# Patient Record
Sex: Female | Born: 1980 | Race: Black or African American | Hispanic: No | Marital: Single | Smoking: Former smoker
Health system: Southern US, Community
[De-identification: ages and names within clinical notes are randomized; demographics above are authoritative.]

## PROBLEM LIST (undated history)

## (undated) ENCOUNTER — Inpatient Hospital Stay (HOSPITAL_COMMUNITY): Payer: Self-pay

## (undated) DIAGNOSIS — R51 Headache: Secondary | ICD-10-CM

## (undated) DIAGNOSIS — J45909 Unspecified asthma, uncomplicated: Secondary | ICD-10-CM

## (undated) DIAGNOSIS — I10 Essential (primary) hypertension: Secondary | ICD-10-CM

## (undated) DIAGNOSIS — Z789 Other specified health status: Secondary | ICD-10-CM

## (undated) DIAGNOSIS — R519 Headache, unspecified: Secondary | ICD-10-CM

## (undated) HISTORY — DX: Unspecified asthma, uncomplicated: J45.909

## (undated) HISTORY — PX: NO PAST SURGERIES: SHX2092

---

## 2012-07-04 ENCOUNTER — Ambulatory Visit (INDEPENDENT_AMBULATORY_CARE_PROVIDER_SITE_OTHER): Payer: Managed Care, Other (non HMO) | Admitting: Physician Assistant

## 2012-07-04 VITALS — BP 120/92 | HR 101 | Temp 98.1°F | Resp 16 | Ht 65.0 in | Wt 194.0 lb

## 2012-07-04 DIAGNOSIS — J020 Streptococcal pharyngitis: Secondary | ICD-10-CM

## 2012-07-04 DIAGNOSIS — J029 Acute pharyngitis, unspecified: Secondary | ICD-10-CM

## 2012-07-04 MED ORDER — DIPHENHYD-HYDROCORT-NYSTATIN MT SUSP: 5.0000 mL | Freq: Four times a day (QID) | OROMUCOSAL | Status: DC | PRN

## 2012-07-04 MED ORDER — AMOXICILLIN 875 MG PO TABS: 875.0000 mg | ORAL_TABLET | Freq: Two times a day (BID) | ORAL | Status: DC

## 2012-07-04 NOTE — Progress Notes (Signed)
    Patient ID: Alexandra Curtis MRN: [id number], DOB: 1981-09-22, 31 y.o. Date of Encounter: 07/04/2012, 6:31 PM  Primary Physician: No primary provider on file.  Chief Complaint:  Chief Complaint  Patient presents with  . Sore Throat    yesterday  . Chills  . Night Sweats    HPI: 31 y.o. year old female presents with a one day history of sore throat. Subjective fever and chills. No cough, congestion, rhinorrhea, sinus pressure, otalgia, or headache. Normal hearing. No GI complaints. Able to swallow saliva, but hurts to do so. Decreased appetite secondary to sore throat. Multiple sick contacts at work as she works in [employer].    No past medical history on file.   Home Meds: Prior to Admission medications   Not on File    Allergies: No Known Allergies  History   Social History  . Marital Status: Single    Spouse Name: N/A    Number of Children: N/A  . Years of Education: N/A   Occupational History  . Not on file.   Social History Main Topics  . Smoking status: Current Everyday Smoker -- 0.2 packs/day    Types: Cigarettes  . Smokeless tobacco: Not on file  . Alcohol Use: Not on file  . Drug Use: Not on file  . Sexually Active: Not on file   Other Topics Concern  . Not on file   Social History Narrative  . No narrative on file     Review of Systems: Constitutional: negative for night sweats or weight changes HEENT: see above Cardiovascular: negative for chest pain or palpitations Respiratory: negative for hemoptysis, wheezing, or shortness of breath Abdominal: negative for abdominal pain, nausea, vomiting or diarrhea Dermatological: negative for rash Neurologic: negative for headache   Physical Exam: Blood pressure 120/92, pulse 101, temperature 98.1 F (36.7 C), temperature source Oral, resp. rate 16, height 5\' 5"  (1.651 m), weight 194 lb (87.998 kg), last menstrual period 06/25/2012, SpO2 99.00%., Body mass index is 32.28 kg/(m^2). General: Well  developed, well nourished, in no acute distress. Head: Normocephalic, atraumatic, eyes without discharge, sclera non-icteric, nares are patent. Bilateral auditory canals clear, TM's are without perforation, pearly grey with reflective cone of light bilaterally. No sinus TTP. Oral cavity moist, dentition normal. Posterior pharynx erythematous with tonsillar exudate. Tonsils 2+ bilaterally. No peritonsillar abscess. Neck: Supple. No thyromegaly. Full ROM. <2cm AC. Lungs: Clear bilaterally to auscultation without wheezes, rales, or rhonchi. Breathing is unlabored. Heart: RRR with S1 S2. No murmurs, rubs, or gallops appreciated. Msk:  Strength and tone normal for age. Extremities: No clubbing or cyanosis. No edema. Neuro: Alert and oriented X 3. Moves all extremities spontaneously. CNII-XII grossly in tact. Psych:  Responds to questions appropriately with a normal affect.   Labs: Results for orders placed in visit on 07/04/12  POCT RAPID STREP A (OFFICE)      Component Value Range   Rapid Strep A Screen Positive (*) Negative     ASSESSMENT AND PLAN:  31 y.o. year old female with strep pharyngitis -Amoxicillin 875 mg 1 po bid #20 no RF -DMM with lidocaine 1 tsp po qid prn #120 mL no RF -New tooth brush -Tylenol/Motrin prn -Rest/fluids -RTC precautions -RTC 3-5 days if no improvement  Elinor Dodge, PA-C 07/04/2012 6:31 PM

## 2012-08-05 ENCOUNTER — Ambulatory Visit: Payer: Managed Care, Other (non HMO) | Admitting: Emergency Medicine

## 2012-08-05 VITALS — BP 136/91 | HR 69 | Temp 97.8°F | Resp 18 | Ht 65.5 in | Wt 190.2 lb

## 2012-08-05 DIAGNOSIS — R21 Rash and other nonspecific skin eruption: Secondary | ICD-10-CM

## 2012-08-05 LAB — POCT SKIN KOH: Skin KOH, POC: NEGATIVE

## 2012-08-05 MED ORDER — PREDNISONE 10 MG PO TABS: ORAL_TABLET | ORAL | Status: DC

## 2012-08-05 NOTE — Progress Notes (Signed)
  Subjective:    Patient ID: Alexandra Curtis, female    DOB: December 07, 1981, 31 y.o.   MRN: [id number]  HPI  31 year old Black woman is here today with complaints of bites all over her body for two weeks. Pt states she has been living in a hotel due to her house been renovated and moved in with a friend to lower her expenses. Pt states the rash started about two weeks. Pt states the rash itches and is only on the upper extremities. She has been taking Benadryl but no other medications     Review of Systems     Objective:   Physical Exam there are multiple raised scaly patches involving the trunk. There is sparing of the face sparing of the lower extremities. These areas are excoriated. They did not have the appearance of hives and there no individual bite-like areas  KOH Neg      Assessment & Plan:  Patient has typical psoriasis rosea. I gave her information about this she is itching so badly I'm going to give her a six-day course of prednisone and have her take Zyrtec one a day along with the Benadryl she is taking

## 2012-08-05 NOTE — Patient Instructions (Addendum)
Pityriasis Rosea Pityriasis rosea is a rash which is probably caused by a virus. It generally starts as a scaly, red patch on the trunk (the area of the body that a t-shirt would cover) but does not appear on sun exposed areas. The rash is usually preceded by an initial larger spot called the "herald patch" a week or more before the rest of the rash appears. Generally within one to two days the rash appears rapidly on the trunk, upper arms, and sometimes the upper legs. The rash usually appears as flat, oval patches of scaly pink color. The rash can also be raised and one is able to feel it with a finger. The rash can also be finely crinkled and may slough off leaving a ring of scale around the spot. Sometimes a mild sore throat is present with the rash. It usually affects children and young adults in the spring and autumn. Women are more frequently affected than men. TREATMENT  Pityriasis rosea is a self-limited condition. This means it goes away within 4 to 8 weeks without treatment. The spots may persist for several months, especially in darker-colored skin after the rash has resolved and healed. Benadryl and steroid creams may be used if itching is a problem. SEEK MEDICAL CARE IF:   Your rash does not go away or persists longer than three months.   You develop fever and joint pain.   You develop severe headache and confusion.   You develop breathing difficulty, vomiting and/or extreme weakness.  Document Released: 01/19/2002 Document Revised: 12/02/2011 Document Reviewed: 02/07/2009 ExitCare Patient Information 2012 [employer]. 

## 2012-08-16 ENCOUNTER — Telehealth: Payer: Self-pay

## 2012-08-16 NOTE — Telephone Encounter (Signed)
Broken out with a rash, would like more pills called in [facility] [address] @ [facility].   Call [phone]

## 2012-08-17 NOTE — Telephone Encounter (Signed)
Called pt but voicemail was too full to leave message to get more information.

## 2012-08-21 NOTE — Telephone Encounter (Signed)
Yes, RTC

## 2012-08-21 NOTE — Telephone Encounter (Signed)
Tried to call pt again and VM still full. Could not LM

## 2012-08-21 NOTE — Telephone Encounter (Signed)
Patient would likely need re-eval before more meds could be called in, correct?

## 2012-08-23 NOTE — Telephone Encounter (Signed)
Spoke with patient and she is still experiencing rash, despite using triamcinolone cream and benadryl.  Plans to RTC this Saturday.

## 2012-08-26 ENCOUNTER — Ambulatory Visit: Payer: Managed Care, Other (non HMO)

## 2013-05-16 ENCOUNTER — Other Ambulatory Visit (HOSPITAL_COMMUNITY)
Admission: RE | Admit: 2013-05-16 | Discharge: 2013-05-16 | Disposition: A | Discharge status: Home / Self Care | Payer: Managed Care, Other (non HMO) | Source: Ambulatory Visit | Attending: Family Medicine | Admitting: Family Medicine

## 2013-05-16 ENCOUNTER — Other Ambulatory Visit: Payer: Self-pay | Admitting: Family Medicine

## 2013-05-16 DIAGNOSIS — Z01419 Encounter for gynecological examination (general) (routine) without abnormal findings: Secondary | ICD-10-CM | POA: Insufficient documentation

## 2014-03-07 ENCOUNTER — Ambulatory Visit (INDEPENDENT_AMBULATORY_CARE_PROVIDER_SITE_OTHER): Payer: Managed Care, Other (non HMO) | Admitting: Family Medicine

## 2014-03-07 VITALS — BP 160/98 | HR 97 | Temp 98.9°F | Resp 16 | Ht 65.5 in | Wt 198.6 lb

## 2014-03-07 DIAGNOSIS — S0512XA Contusion of eyeball and orbital tissues, left eye, initial encounter: Secondary | ICD-10-CM

## 2014-03-07 DIAGNOSIS — S0510XA Contusion of eyeball and orbital tissues, unspecified eye, initial encounter: Secondary | ICD-10-CM

## 2014-03-07 MED ORDER — HYDROCODONE-ACETAMINOPHEN 5-325 MG PO TABS: 1.0000 | ORAL_TABLET | Freq: Four times a day (QID) | ORAL | Status: DC | PRN

## 2014-03-07 NOTE — Patient Instructions (Signed)
Rest for 2 days.  Apply cool compresses.

## 2014-03-07 NOTE — Progress Notes (Signed)
Patient who works at [employer] and struck her left orbit when she tripped on a shoe at home. Upper left eyelid is tender and she has a mild headache.  No syncope.  Objective:  NAD Left upper lid ecchymotic.  Small 3/8 inch partial thickness left eyebrow lac.  EOM ok  Good light reflex and normal fundus. Alert, cooperative HEENT: otherwise neg CN III-XII:  Intact Gait stable  Assessment:  Contused left orbit  Plan:  Ice, norco, rest.  Signed, Elvina SidleKurt Aashvi Rezabek, MD

## 2014-09-28 ENCOUNTER — Inpatient Hospital Stay (HOSPITAL_COMMUNITY): Payer: Managed Care, Other (non HMO)

## 2014-09-28 ENCOUNTER — Inpatient Hospital Stay (HOSPITAL_COMMUNITY)
Admission: AD | Admit: 2014-09-28 | Discharge: 2014-09-28 | Disposition: A | Discharge status: Home / Self Care | Payer: Managed Care, Other (non HMO) | Source: Ambulatory Visit | Attending: Family Medicine | Admitting: Family Medicine

## 2014-09-28 ENCOUNTER — Encounter (HOSPITAL_COMMUNITY): Payer: Self-pay | Admitting: *Deleted

## 2014-09-28 DIAGNOSIS — Z3A11 11 weeks gestation of pregnancy: Secondary | ICD-10-CM | POA: Diagnosis not present

## 2014-09-28 DIAGNOSIS — Z87891 Personal history of nicotine dependence: Secondary | ICD-10-CM | POA: Insufficient documentation

## 2014-09-28 DIAGNOSIS — O99011 Anemia complicating pregnancy, first trimester: Secondary | ICD-10-CM | POA: Insufficient documentation

## 2014-09-28 DIAGNOSIS — O200 Threatened abortion: Secondary | ICD-10-CM | POA: Diagnosis not present

## 2014-09-28 DIAGNOSIS — D649 Anemia, unspecified: Secondary | ICD-10-CM | POA: Diagnosis not present

## 2014-09-28 DIAGNOSIS — O4691 Antepartum hemorrhage, unspecified, first trimester: Secondary | ICD-10-CM | POA: Diagnosis present

## 2014-09-28 DIAGNOSIS — O99019 Anemia complicating pregnancy, unspecified trimester: Secondary | ICD-10-CM

## 2014-09-28 HISTORY — DX: Other specified health status: Z78.9

## 2014-09-28 LAB — CBC
HEMATOCRIT: 32.2 % — AB (ref 36.0–46.0)
Hemoglobin: 11 g/dL — ABNORMAL LOW (ref 12.0–15.0)
MCH: 30.6 pg (ref 26.0–34.0)
MCHC: 34.2 g/dL (ref 30.0–36.0)
MCV: 89.7 fL (ref 78.0–100.0)
Platelets: 207 10*3/uL (ref 150–400)
RBC: 3.59 MIL/uL — ABNORMAL LOW (ref 3.87–5.11)
RDW: 13 % (ref 11.5–15.5)
WBC: 6.2 10*3/uL (ref 4.0–10.5)

## 2014-09-28 LAB — WET PREP, GENITAL
Trich, Wet Prep: NONE SEEN
YEAST WET PREP: NONE SEEN

## 2014-09-28 LAB — HCG, QUANTITATIVE, PREGNANCY: hCG, Beta Chain, Quant, S: 16849 m[IU]/mL — ABNORMAL HIGH (ref <5)

## 2014-09-28 MED ORDER — PROMETHAZINE HCL 25 MG PO TABS: 25.0000 mg | ORAL_TABLET | Freq: Four times a day (QID) | ORAL | Status: DC | PRN

## 2014-09-28 MED ORDER — OXYCODONE-ACETAMINOPHEN 5-325 MG PO TABS: 2.0000 | ORAL_TABLET | ORAL | Status: DC | PRN

## 2014-09-28 NOTE — Discharge Instructions (Signed)
Anemia, Nonspecific Anemia is a condition in which the concentration of red blood cells or hemoglobin in the blood is below normal. Hemoglobin is a substance in red blood cells that carries oxygen to the tissues of the body. Anemia results in not enough oxygen reaching these tissues.  CAUSES  Common causes of anemia include:   Excessive bleeding. Bleeding may be internal or external. This includes excessive bleeding from periods (in women) or from the intestine.   Poor nutrition.   Chronic kidney, thyroid, and liver disease.  Bone marrow disorders that decrease red blood cell production.  Cancer and treatments for cancer.  HIV, AIDS, and their treatments.  Spleen problems that increase red blood cell destruction.  Blood disorders.  Excess destruction of red blood cells due to infection, medicines, and autoimmune disorders. SIGNS AND SYMPTOMS   Minor weakness.   Dizziness.   Headache.  Palpitations.   Shortness of breath, especially with exercise.   Paleness.  Cold sensitivity.  Indigestion.  Nausea.  Difficulty sleeping.  Difficulty concentrating. Symptoms may occur suddenly or they may develop slowly.  DIAGNOSIS  Additional blood tests are often needed. These help your health care provider determine the best treatment. Your health care provider will check your stool for blood and look for other causes of blood loss.  TREATMENT  Treatment varies depending on the cause of the anemia. Treatment can include:   Supplements of iron, vitamin B12, or folic acid.   Hormone medicines.   A blood transfusion. This may be needed if blood loss is severe.   Hospitalization. This may be needed if there is significant continual blood loss.   Dietary changes.  Spleen removal. HOME CARE INSTRUCTIONS Keep all follow-up appointments. It often takes many weeks to correct anemia, and having your health care provider check on your condition and your response to  treatment is very important. SEEK IMMEDIATE MEDICAL CARE IF:   You develop extreme weakness, shortness of breath, or chest pain.   You become dizzy or have trouble concentrating.  You develop heavy vaginal bleeding.   You develop a rash.   You have bloody or black, tarry stools.   You faint.   You vomit up blood.   You vomit repeatedly.   You have abdominal pain.  You have a fever or persistent symptoms for more than 2-3 days.   You have a fever and your symptoms suddenly get worse.   You are dehydrated.  MAKE SURE YOU:  Understand these instructions.  Will watch your condition.  Will get help right away if you are not doing well or get worse. Document Released: 01/20/2005 Document Revised: 08/15/2013 Document Reviewed: 06/08/2013 [facility] Patient Information 2015 [place]. This information is not intended to replace advice given to you by your health care provider. Make sure you discuss any questions you have with your health care provider. Threatened Miscarriage A threatened miscarriage occurs when you have vaginal bleeding during your first 20 weeks of pregnancy but the pregnancy has not ended. If you have vaginal bleeding during this time, your health care provider will do tests to make sure you are still pregnant. If the tests show you are still pregnant and the developing baby (fetus) inside your womb (uterus) is still growing, your condition is considered a threatened miscarriage. A threatened miscarriage does not mean your pregnancy will end, but it does increase the risk of losing your pregnancy (complete miscarriage). CAUSES  The cause of a threatened miscarriage is usually not known. If you  go on to have a complete miscarriage, the most common cause is an abnormal number of chromosomes in the developing baby. Chromosomes are the structures inside cells that hold all your genetic material. Some causes of vaginal bleeding that do not result in  miscarriage include:  Having sex.  Having an infection.  Normal hormone changes of pregnancy.  Bleeding that occurs when an egg implants in your uterus. RISK FACTORS Risk factors for bleeding in early pregnancy include:  Obesity.  Smoking.  Drinking excessive amounts of alcohol or caffeine.  Recreational drug use. SIGNS AND SYMPTOMS  Light vaginal bleeding.  Mild abdominal pain or cramps. DIAGNOSIS  If you have bleeding with or without abdominal pain before 20 weeks of pregnancy, your health care provider will do tests to check whether you are still pregnant. One important test involves using sound waves and a computer (ultrasound) to create images of the inside of your uterus. Other tests include an internal exam of your vagina and uterus (pelvic exam) and measurement of your baby's heart rate.  You may be diagnosed with a threatened miscarriage if:  Ultrasound testing shows you are still pregnant.  Your baby's heart rate is strong.  A pelvic exam shows that the opening between your uterus and your vagina (cervix) is closed.  Your heart rate and blood pressure are stable.  Blood tests confirm you are still pregnant. TREATMENT  No treatments have been shown to prevent a threatened miscarriage from going on to a complete miscarriage. However, the right home care is important.  HOME CARE INSTRUCTIONS   Make sure you keep all your appointments for prenatal care. This is very important.  Get plenty of rest.  Do not have sex or use tampons if you have vaginal bleeding.  Do not douche.  Do not smoke or use recreational drugs.  Do not drink alcohol.  Avoid caffeine. SEEK MEDICAL CARE IF:  You have light vaginal bleeding or spotting while pregnant.  You have abdominal pain or cramping.  You have a fever. SEEK IMMEDIATE MEDICAL CARE IF:  You have heavy vaginal bleeding.  You have blood clots coming from your vagina.  You have severe low back pain or abdominal  cramps.  You have fever, chills, and severe abdominal pain. MAKE SURE YOU:  Understand these instructions.  Will watch your condition.  Will get help right away if you are not doing well or get worse. Document Released: 12/13/2005 Document Revised: 12/18/2013 Document Reviewed: 10/09/2013 [facility] Patient Information 2015 [place], [place]. This information is not intended to replace advice given to you by your health care provider. Make sure you discuss any questions you have with your health care provider.

## 2014-09-28 NOTE — MAU Note (Signed)
Pt presents from EMS for vaginal bleeding. Was sitting at work and felt like she voided on herself. Stood up and saw3 blood spot. Went to restroom and saw more blood in commode. Hx miscarriage in past.

## 2014-09-28 NOTE — MAU Provider Note (Signed)
Attestation of Attending Supervision of Advanced Practitioner (PA/CNM/NP): Evaluation and management procedures were performed by the Advanced Practitioner under my supervision and collaboration.  I have reviewed the Advanced Practitioner's note and chart, and I agree with the management and plan.  Hoa Briggs S, MD [facility] Attending 09/28/2014 9:33 PM   

## 2014-09-28 NOTE — MAU Provider Note (Signed)
Chief Complaint: Vaginal Bleeding   First Provider Initiated Contact with Patient 09/28/14 1909     SUBJECTIVE HPI: Alexandra Curtis is a 33 y.o. G5P1010 at [id number] by uncertain LMP who presents with moderate vaginal bleeding and passing clots today. Denies passage of tissue, abdominal pain, fever, chills. Positive UPT in August. LMP between 07/07/2014 and 07/14/2014. 28 day cycles. No previous ultrasounds or Quants this pregnancy.  [facility]. First visit next week.  Past Medical History  Diagnosis Date  . Medical history non-contributory    OB History  Gravida Para Term Preterm AB SAB TAB Ectopic Multiple Living  5 3 1  1 1         # Outcome Date GA Lbr Len/2nd Weight Sex Delivery Anes PTL Lv  5 CUR           4 PAR 2002    M SVD     3 PAR 2001    M SVD     2 TRM 1997    F SVD  N   1 SAB 1995             Past Surgical History  Procedure Laterality Date  . No past surgeries     History   Social History  . Marital Status: Single    Spouse Name: N/A    Number of Children: N/A  . Years of Education: N/A   Occupational History  . Not on file.   Social History Main Topics  . Smoking status: Former Smoker -- 0.20 packs/day    Types: Cigarettes  . Smokeless tobacco: Not on file  . Alcohol Use: No  . Drug Use: No  . Sexual Activity: Not on file   Other Topics Concern  . Not on file   Social History Narrative  . No narrative on file   No current facility-administered medications on file prior to encounter.   No current outpatient prescriptions on file prior to encounter.   No Known Allergies  ROS: Pertinent positive items in HPI. Negative for fever, chills, abdominal pain, dizziness, passage of tissue, urinary complaints, GI complaints.  OBJECTIVE Blood pressure 141/71, pulse 85, temperature 98.1 F (36.7 C), temperature source Oral, resp. rate 18, last menstrual period 07/07/2014. GENERAL: Well-developed, well-nourished female in no  acute distress.  HEENT: Normocephalic HEART: normal rate RESP: normal effort ABDOMEN: Soft, non-tender Genitalia: External bloody Vagina: moderate amount blood Cervix closed/ thick Adnexa: Tenderness/ no mass EXTREMITIES: Nontender, no edema NEURO: Alert and oriented  LAB RESULTS Results for orders placed during the hospital encounter of 09/28/14 (from the past 24 hour(s))  HCG, QUANTITATIVE, PREGNANCY     Status: Abnormal   Collection Time    09/28/14  7:15 PM      Result Value Ref Range   hCG, Beta Chain, Alexandra Curtis (*) <5 mIU/mL  ABO/RH     Status: None   Collection Time    09/28/14  7:15 PM      Result Value Ref Range   ABO/RH(D) O POS    CBC     Status: Abnormal   Collection Time    09/28/14  7:15 PM      Result Value Ref Range   WBC 6.2  4.0 - 10.5 K/uL   RBC 3.59 (*) 3.87 - 5.11 MIL/uL   Hemoglobin 11.0 (*) 12.0 - 15.0 g/dL   HCT 65.732.2 (*) 84.636.0 - 96.246.0 %   MCV 89.7  78.0 - 100.0 fL  MCH 30.6  26.0 - 34.0 pg   MCHC 34.2  30.0 - 36.0 g/dL   RDW 60.4  54.0 - 98.1 %   Platelets 207  150 - 400 K/uL    IMAGING US Ob Comp Less 14 Wks  09/28/2014   CLINICAL DATA:  Vaginal bleeding today. Quantitative beta HCG pending. Estimated gestational age per LMP 11 weeks 6 days.  EXAM: OBSTETRIC <14 WK [place] AND TRANSVAGINAL OB US  TECHNIQUE: Both transabdominal and transvaginal ultrasound examinations were performed for complete evaluation of the gestation as well as the maternal uterus, adnexal regions, and pelvic cul-de-sac. Transvaginal technique was performed to assess early pregnancy.  COMPARISON:  None.  FINDINGS: Intrauterine gestational sac: Elongated gestational sac with single septation and mild internal debris.  Yolk sac:  Not visualized.  Embryo:  Not visualized.  Cardiac Activity: Not visualized.  Heart Rate:  Not visualized.  MSD:  23  mm   7 w   3  d  [place] EDC: 05/14/2015  Maternal uterus/adnexae: Ovaries are normal in size, shape and position. No significant free pelvic  fluid.  IMPRESSION: Abnormal appearing intrauterine gestational sac with mean sac diameter 23 mm without yolk sac or embryo. Findings are suspicious for failed pregnancy as recommend correlation with quantitative HCG and follow-up ultrasound 1-2 weeks.   Electronically Signed   By: Alexandra Curtis M.D.   On: 09/28/2014 20:17   US Ob Transvaginal  09/28/2014   CLINICAL DATA:  Vaginal bleeding today. Quantitative beta HCG pending. Estimated gestational age per LMP 11 weeks 6 days.  EXAM: OBSTETRIC <14 WK [place] AND TRANSVAGINAL OB US  TECHNIQUE: Both transabdominal and transvaginal ultrasound examinations were performed for complete evaluation of the gestation as well as the maternal uterus, adnexal regions, and pelvic cul-de-sac. Transvaginal technique was performed to assess early pregnancy.  COMPARISON:  None.  FINDINGS: Intrauterine gestational sac: Elongated gestational sac with single septation and mild internal debris.  Yolk sac:  Not visualized.  Embryo:  Not visualized.  Cardiac Activity: Not visualized.  Heart Rate:  Not visualized.  MSD:  23  mm   7 w   3  d  [place] EDC: 05/14/2015  Maternal uterus/adnexae: Ovaries are normal in size, shape and position. No significant free pelvic fluid.  IMPRESSION: Abnormal appearing intrauterine gestational sac with mean sac diameter 23 mm without yolk sac or embryo. Findings are suspicious for failed pregnancy as recommend correlation with quantitative HCG and follow-up ultrasound 1-2 weeks.   Electronically Signed   By: Alexandra Curtis M.D.   On: 09/28/2014 20:17   MAU COURSE Care of patient turned over to Alexandra Curtis Alexandra Shane, NP at 8:30 PM.  A: Threatened Miscarriage Anemia  P; Reviewed above results with patient Pelvic rest Repeat BHCG in 48 hours Repeat U/S in 7 days/ schedule for 10/07/14 with MAU follow up Pt has an appointment with Dr Alexandra Curtis on Tuesday Encouraged to take PNV and increase iron intake Note for work till Tuesday  Alexandra Curtis, [place] 09/28/2014   8:29 PM

## 2014-09-29 LAB — ABO/RH: ABO/RH(D): O POS

## 2014-09-29 LAB — HIV ANTIBODY (ROUTINE TESTING W REFLEX): HIV: NONREACTIVE

## 2014-09-30 ENCOUNTER — Inpatient Hospital Stay (HOSPITAL_COMMUNITY)
Admission: AD | Admit: 2014-09-30 | Discharge: 2014-10-01 | Disposition: A | Discharge status: Home / Self Care | Payer: Managed Care, Other (non HMO) | Source: Ambulatory Visit | Attending: Obstetrics & Gynecology | Admitting: Obstetrics & Gynecology

## 2014-09-30 DIAGNOSIS — O039 Complete or unspecified spontaneous abortion without complication: Secondary | ICD-10-CM

## 2014-09-30 DIAGNOSIS — O034 Incomplete spontaneous abortion without complication: Secondary | ICD-10-CM | POA: Insufficient documentation

## 2014-09-30 DIAGNOSIS — Z3A12 12 weeks gestation of pregnancy: Secondary | ICD-10-CM | POA: Insufficient documentation

## 2014-09-30 LAB — GC/CHLAMYDIA PROBE AMP
CT Probe RNA: NEGATIVE
GC Probe RNA: NEGATIVE

## 2014-09-30 NOTE — MAU Note (Signed)
Pt reports she is here for repeat BHCG. States she is still having some bleeding and some pain.

## 2014-10-01 DIAGNOSIS — Z3A12 12 weeks gestation of pregnancy: Secondary | ICD-10-CM | POA: Diagnosis not present

## 2014-10-01 DIAGNOSIS — O039 Complete or unspecified spontaneous abortion without complication: Secondary | ICD-10-CM

## 2014-10-01 DIAGNOSIS — O034 Incomplete spontaneous abortion without complication: Secondary | ICD-10-CM | POA: Diagnosis present

## 2014-10-01 LAB — HCG, QUANTITATIVE, PREGNANCY: hCG, Beta Chain, Quant, S: 13149 m[IU]/mL — ABNORMAL HIGH (ref <5)

## 2014-10-01 MED ORDER — IBUPROFEN 600 MG PO TABS: 600.0000 mg | ORAL_TABLET | Freq: Four times a day (QID) | ORAL | Status: DC | PRN

## 2014-10-01 MED ORDER — OXYCODONE-ACETAMINOPHEN 5-325 MG PO TABS: 2.0000 | ORAL_TABLET | ORAL | Status: DC | PRN

## 2014-10-01 NOTE — MAU Provider Note (Signed)
S: 33 y.o. G5P1010 @[redacted]w[redacted]d  by LMP presents to MAU for follow up quant hcg.  She reports moderate vaginal bleeding with clots, less bleeding than she had 2 days ago in MAU.  She reports abdominal cramping continues.  She denies vaginal itching/burning, urinary symptoms, h/a, dizziness, n/v, or fever/chills.      O: BP 146/81  Pulse 85  Temp(Src) 98.6 F (37 C) (Oral)  Resp 18  Ht 5\' 5"  (1.651 m)  Wt 98.884 kg (218 lb)  BMI 36.28 kg/m2  SpO2 100%  LMP 07/07/2014  Results for orders placed during the hospital encounter of 09/30/14 (from the past 168 hour(s))  HCG, QUANTITATIVE, PREGNANCY   Collection Time    10/01/14 12:20 AM      Result Value Ref Range   hCG, Beta Chain, Quant, S 13149 (*) <5 mIU/mL  Results for orders placed during the hospital encounter of 09/28/14 (from the past 168 hour(s))  HCG, QUANTITATIVE, PREGNANCY   Collection Time    09/28/14  7:15 PM      Result Value Ref Range   hCG, Beta Chain, Quant, S 16849 (*) <5 mIU/mL    A: Incomplete SAB  P: D/C home Reviewed drop in quant hcg with pt.  Answered questions about miscarriage, what to expect, etc.   Pt has appointment this week with Dr Richardson Doppole.  Recommend call office to see if she should keep appt or reschedule follow up. Return to MAU as needed for emergencies    Sharen CounterLisa Leftwich-Kirby Certified Nurse-Midwife

## 2014-10-01 NOTE — Discharge Instructions (Signed)
Incomplete Miscarriage A miscarriage is the sudden loss of an unborn baby (fetus) before the 20th week of pregnancy. In an incomplete miscarriage, parts of the fetus or placenta (afterbirth) remain in the body.  Having a miscarriage can be an emotional experience. Talk with your health care provider about any questions you may have about miscarrying, the grieving process, and your future pregnancy plans. CAUSES   Problems with the fetal chromosomes that make it impossible for the baby to develop normally. Problems with the baby's genes or chromosomes are most often the result of errors that occur by chance as the embryo divides and grows. The problems are not inherited from the parents.  Infection of the cervix or uterus.  Hormone problems.  Problems with the cervix, such as having an incompetent cervix. This is when the tissue in the cervix is not strong enough to hold the pregnancy.  Problems with the uterus, such as an abnormally shaped uterus, uterine fibroids, or congenital abnormalities.  Certain medical conditions.  Smoking, drinking alcohol, or taking illegal drugs.  Trauma. SYMPTOMS   Vaginal bleeding or spotting, with or without cramps or pain.  Pain or cramping in the abdomen or lower back.  Passing fluid, tissue, or blood clots from the vagina. DIAGNOSIS  Your health care provider will perform a physical exam. You may also have an ultrasound to confirm the miscarriage. Blood or urine tests may also be ordered. TREATMENT   Usually, a dilation and curettage (D&C) procedure is performed. During a D&C procedure, the cervix is widened (dilated) and any remaining fetal or placental tissue is gently removed from the uterus.  Antibiotic medicines are prescribed if there is an infection. Other medicines may be given to reduce the size of the uterus (contract) if there is a lot of bleeding.  If you have Rh negative blood and your baby was Rh positive, you will need a Rho (D)  immune globulin shot. This shot will protect any future baby from having Rh blood problems in future pregnancies.  You may be confined to bed rest. This means you should stay in bed and only get up to use the bathroom. HOME CARE INSTRUCTIONS   Rest as directed by your health care provider.  Restrict activity as directed by your health care provider. You may be allowed to continue light activity if curettage was not done but you require further treatment.  Keep track of the number of pads you use each day. Keep track of how soaked (saturated) they are. Record this information.  Do not  use tampons.  Do not douche or have sexual intercourse until approved by your health care provider.  Keep all follow-up appointments for reevaluation and continuing management.  Only take over-the-counter or prescription medicines for pain, fever, or discomfort as directed by your health care provider.  Take antibiotic medicine as directed by your health care provider. Make sure you finish it even if you start to feel better. SEEK IMMEDIATE MEDICAL CARE IF:   You experience severe cramps in your stomach, back, or abdomen.  You have an unexplained temperature (make sure to record these temperatures).  You pass large clots or tissue (save these for your health care provider to inspect).  Your bleeding increases.  You become light-headed, weak, or have fainting episodes. MAKE SURE YOU:   Understand these instructions.  Will watch your condition.  Will get help right away if you are not doing well or get worse. Document Released: 12/13/2005 Document Revised: 04/29/2014 Document Reviewed:   07/12/2013 Exit[employer] Patient Information 2015 [employer]. This information is not intended to replace advice given to you by your health care provider. Make sure you discuss any questions you have with your health care provider.  

## 2014-10-02 ENCOUNTER — Inpatient Hospital Stay (HOSPITAL_COMMUNITY): Payer: Managed Care, Other (non HMO)

## 2014-10-02 ENCOUNTER — Encounter (HOSPITAL_COMMUNITY): Payer: Managed Care, Other (non HMO) | Admitting: Anesthesiology

## 2014-10-02 ENCOUNTER — Encounter (HOSPITAL_COMMUNITY): Payer: Self-pay | Admitting: *Deleted

## 2014-10-02 ENCOUNTER — Other Ambulatory Visit: Payer: Self-pay | Admitting: Obstetrics and Gynecology

## 2014-10-02 ENCOUNTER — Encounter (HOSPITAL_COMMUNITY)
Admission: RE | Disposition: A | Discharge status: Home / Self Care | Payer: Self-pay | Source: Ambulatory Visit | Attending: Obstetrics and Gynecology

## 2014-10-02 ENCOUNTER — Inpatient Hospital Stay (HOSPITAL_COMMUNITY): Payer: Managed Care, Other (non HMO) | Admitting: Anesthesiology

## 2014-10-02 ENCOUNTER — Ambulatory Visit (HOSPITAL_COMMUNITY): Payer: Managed Care, Other (non HMO)

## 2014-10-02 ENCOUNTER — Ambulatory Visit (HOSPITAL_COMMUNITY)
Admission: RE | Admit: 2014-10-02 | Discharge: 2014-10-02 | Disposition: A | Discharge status: Home / Self Care | Payer: Managed Care, Other (non HMO) | Source: Ambulatory Visit | Attending: Obstetrics and Gynecology | Admitting: Obstetrics and Gynecology

## 2014-10-02 DIAGNOSIS — O021 Missed abortion: Secondary | ICD-10-CM | POA: Insufficient documentation

## 2014-10-02 DIAGNOSIS — Z87891 Personal history of nicotine dependence: Secondary | ICD-10-CM | POA: Insufficient documentation

## 2014-10-02 DIAGNOSIS — O039 Complete or unspecified spontaneous abortion without complication: Secondary | ICD-10-CM

## 2014-10-02 HISTORY — PX: DILATION AND EVACUATION: SHX1459

## 2014-10-02 LAB — CBC
HCT: 31.5 % — ABNORMAL LOW (ref 36.0–46.0)
Hemoglobin: 10.8 g/dL — ABNORMAL LOW (ref 12.0–15.0)
MCH: 30.8 pg (ref 26.0–34.0)
MCHC: 34.3 g/dL (ref 30.0–36.0)
MCV: 89.7 fL (ref 78.0–100.0)
PLATELETS: 176 10*3/uL (ref 150–400)
RBC: 3.51 MIL/uL — ABNORMAL LOW (ref 3.87–5.11)
RDW: 13.2 % (ref 11.5–15.5)
WBC: 7.3 10*3/uL (ref 4.0–10.5)

## 2014-10-02 SURGERY — DILATION AND EVACUATION, UTERUS
Anesthesia: Monitor Anesthesia Care | Site: Uterus

## 2014-10-02 MED ORDER — KETOROLAC TROMETHAMINE 30 MG/ML IJ SOLN: 15.0000 mg | Freq: Once | INTRAMUSCULAR | Status: DC | PRN

## 2014-10-02 MED ORDER — SCOPOLAMINE 1 MG/3DAYS TD PT72
MEDICATED_PATCH | TRANSDERMAL | Status: DC | PRN
  Administered 2014-10-02: 1 via TRANSDERMAL

## 2014-10-02 MED ORDER — FENTANYL CITRATE 0.05 MG/ML IJ SOLN
INTRAMUSCULAR | Status: AC
  Filled 2014-10-02: qty 5

## 2014-10-02 MED ORDER — LACTATED RINGERS IV BOLUS (SEPSIS)
1000.0000 mL | Freq: Once | INTRAVENOUS | Status: AC
  Administered 2014-10-02: 1000 mL via INTRAVENOUS

## 2014-10-02 MED ORDER — FENTANYL CITRATE 0.05 MG/ML IJ SOLN: 25.0000 ug | INTRAMUSCULAR | Status: DC | PRN

## 2014-10-02 MED ORDER — LIDOCAINE HCL (CARDIAC) 20 MG/ML IV SOLN
INTRAVENOUS | Status: AC
  Filled 2014-10-02: qty 5

## 2014-10-02 MED ORDER — PROPOFOL 10 MG/ML IV EMUL
INTRAVENOUS | Status: AC
  Filled 2014-10-02: qty 20

## 2014-10-02 MED ORDER — PROPOFOL 10 MG/ML IV BOLUS
INTRAVENOUS | Status: DC | PRN
  Administered 2014-10-02 (×3): 20 mg via INTRAVENOUS

## 2014-10-02 MED ORDER — ONDANSETRON HCL 4 MG/2ML IJ SOLN
INTRAMUSCULAR | Status: AC
  Filled 2014-10-02: qty 2

## 2014-10-02 MED ORDER — ONDANSETRON HCL 4 MG/2ML IJ SOLN
INTRAMUSCULAR | Status: DC | PRN
  Administered 2014-10-02: 4 mg via INTRAVENOUS

## 2014-10-02 MED ORDER — DEXAMETHASONE SODIUM PHOSPHATE 10 MG/ML IJ SOLN
INTRAMUSCULAR | Status: DC | PRN
  Administered 2014-10-02 (×2): 2 mg via INTRAVENOUS

## 2014-10-02 MED ORDER — MEPERIDINE HCL 25 MG/ML IJ SOLN: 6.2500 mg | INTRAMUSCULAR | Status: DC | PRN

## 2014-10-02 MED ORDER — METOCLOPRAMIDE HCL 5 MG/ML IJ SOLN: 10.0000 mg | Freq: Once | INTRAMUSCULAR | Status: DC | PRN

## 2014-10-02 MED ORDER — IBUPROFEN 800 MG PO TABS: 800.0000 mg | ORAL_TABLET | Freq: Three times a day (TID) | ORAL | Status: DC | PRN

## 2014-10-02 MED ORDER — SCOPOLAMINE 1 MG/3DAYS TD PT72
MEDICATED_PATCH | TRANSDERMAL | Status: AC
  Filled 2014-10-02: qty 1

## 2014-10-02 MED ORDER — MIDAZOLAM HCL 2 MG/2ML IJ SOLN
INTRAMUSCULAR | Status: AC
Start: 2014-10-02 — End: 2014-10-02
  Filled 2014-10-02: qty 2

## 2014-10-02 MED ORDER — MIDAZOLAM HCL 2 MG/2ML IJ SOLN
INTRAMUSCULAR | Status: DC | PRN
  Administered 2014-10-02: 2 mg via INTRAVENOUS

## 2014-10-02 MED ORDER — BUPIVACAINE HCL (PF) 0.25 % IJ SOLN
INTRAMUSCULAR | Status: AC
  Filled 2014-10-02: qty 30

## 2014-10-02 MED ORDER — FAMOTIDINE IN NACL 20-0.9 MG/50ML-% IV SOLN
20.0000 mg | Freq: Once | INTRAVENOUS | Status: AC
  Administered 2014-10-02: 20 mg via INTRAVENOUS
  Filled 2014-10-02: qty 50

## 2014-10-02 MED ORDER — HYDROMORPHONE HCL 1 MG/ML IJ SOLN
1.0000 mg | Freq: Once | INTRAMUSCULAR | Status: AC
  Administered 2014-10-02: 1 mg via INTRAVENOUS
  Filled 2014-10-02: qty 1

## 2014-10-02 MED ORDER — DEXAMETHASONE SODIUM PHOSPHATE 4 MG/ML IJ SOLN
INTRAMUSCULAR | Status: AC
  Filled 2014-10-02: qty 1

## 2014-10-02 MED ORDER — BUPIVACAINE HCL (PF) 0.25 % IJ SOLN
INTRAMUSCULAR | Status: DC | PRN
  Administered 2014-10-02: 20 mL

## 2014-10-02 MED ORDER — SCOPOLAMINE 1 MG/3DAYS TD PT72: 1.0000 | MEDICATED_PATCH | Freq: Once | TRANSDERMAL | Status: DC | PRN

## 2014-10-02 MED ORDER — LACTATED RINGERS IV SOLN
INTRAVENOUS | Status: DC | PRN
  Administered 2014-10-02: 17:00:00 via INTRAVENOUS

## 2014-10-02 MED ORDER — KETOROLAC TROMETHAMINE 30 MG/ML IJ SOLN
30.0000 mg | Freq: Once | INTRAMUSCULAR | Status: AC
  Administered 2014-10-02: 30 mg via INTRAVENOUS
  Filled 2014-10-02: qty 1

## 2014-10-02 MED ORDER — FENTANYL CITRATE 0.05 MG/ML IJ SOLN
INTRAMUSCULAR | Status: DC | PRN
  Administered 2014-10-02: 100 ug via INTRAVENOUS
  Administered 2014-10-02: 50 ug via INTRAVENOUS

## 2014-10-02 MED ORDER — LIDOCAINE HCL (CARDIAC) 20 MG/ML IV SOLN
INTRAVENOUS | Status: DC | PRN
  Administered 2014-10-02: 50 mg via INTRAVENOUS

## 2014-10-02 MED ORDER — CITRIC ACID-SODIUM CITRATE 334-500 MG/5ML PO SOLN
30.0000 mL | Freq: Once | ORAL | Status: AC
  Administered 2014-10-02: 30 mL via ORAL
  Filled 2014-10-02: qty 15

## 2014-10-02 SURGICAL SUPPLY — 20 items
CATH ROBINSON RED A/P 16FR (CATHETERS) ×3 IMPLANT
CLOTH BEACON ORANGE TIMEOUT ST (SAFETY) ×3 IMPLANT
DECANTER SPIKE VIAL GLASS SM (MISCELLANEOUS) IMPLANT
GLOVE BIOGEL M 6.5 STRL (GLOVE) ×3 IMPLANT
GLOVE BIOGEL PI IND STRL 6.5 (GLOVE) ×2 IMPLANT
GLOVE BIOGEL PI IND STRL 7.0 (GLOVE) ×1 IMPLANT
GLOVE BIOGEL PI INDICATOR 6.5 (GLOVE) ×4
GLOVE BIOGEL PI INDICATOR 7.0 (GLOVE) ×2
GOWN STRL REUS W/TWL LRG LVL3 (GOWN DISPOSABLE) ×6 IMPLANT
KIT BERKELEY 1ST TRIMESTER 3/8 (MISCELLANEOUS) ×3 IMPLANT
NS IRRIG 1000ML POUR BTL (IV SOLUTION) ×3 IMPLANT
PACK VAGINAL MINOR WOMEN LF (CUSTOM PROCEDURE TRAY) ×3 IMPLANT
PAD OB MATERNITY 4.3X12.25 (PERSONAL CARE ITEMS) ×3 IMPLANT
PAD PREP 24X48 CUFFED NSTRL (MISCELLANEOUS) ×3 IMPLANT
SET BERKELEY SUCTION TUBING (SUCTIONS) ×3 IMPLANT
TOWEL OR 17X24 6PK STRL BLUE (TOWEL DISPOSABLE) ×6 IMPLANT
VACURETTE 10 RIGID CVD (CANNULA) IMPLANT
VACURETTE 7MM CVD STRL WRAP (CANNULA) IMPLANT
VACURETTE 8 RIGID CVD (CANNULA) ×3 IMPLANT
VACURETTE 9 RIGID CVD (CANNULA) IMPLANT

## 2014-10-02 NOTE — MAU Note (Signed)
Patient states she was sent from the office today for a D & E at 1630. States she is having bleeding heavier than a period since the exam in the office and having abdominal cramping.

## 2014-10-02 NOTE — Transfer of Care (Signed)
Immediate Anesthesia Transfer of Care Note  Patient: Alexandra Curtis  Procedure(s) Performed: Procedure(s): DILATATION AND EVACUATION WITH ULTRASOUND GUIDANCE  (N/A)  Patient Location: PACU  Anesthesia Type:MAC  Level of Consciousness: sedated  Airway & Oxygen Therapy: Patient Spontanous Breathing  Post-op Assessment: Report given to PACU RN  Post vital signs: Reviewed and stable  Complications: No apparent anesthesia complications

## 2014-10-02 NOTE — MAU Note (Signed)
Pt having painful contractions 6/10 about every 3 minutes.  States also having light vaginal bleeding currently; but at Dr. Dawayne Patriciaole's office much heavier bleeding 1 hour ago.  Also, states that she feels very fatigued, which began 1 hour ago.

## 2014-10-02 NOTE — MAU Provider Note (Signed)
History     CSN: [id number]  Arrival date and time: 10/02/14 1229   None     Chief Complaint  Patient presents with  . Vaginal Bleeding  . Abdominal Pain   HPI Comments: Alexandra Curtis 33 y.o. [id number] presents to MAU for follow up with SAB. She will be having D&E today at 4:30 with Dr Richardson Doppole. She is having cramping pain that is 5/10 and she would like pain medications. Her ultrasound has been done  Vaginal Bleeding Associated symptoms include abdominal pain.  Abdominal Pain      Past Medical History  Diagnosis Date  . Medical history non-contributory     Past Surgical History  Procedure Laterality Date  . No past surgeries      History reviewed. No pertinent family history.  History  Substance Use Topics  . Smoking status: Former Smoker -- 0.20 packs/day    Types: Cigarettes  . Smokeless tobacco: Not on file  . Alcohol Use: No    Allergies: No Known Allergies  Prescriptions prior to admission  Medication Sig Dispense Refill  . acetaminophen (TYLENOL) 325 MG tablet Take 325 mg by mouth every 6 (six) hours as needed for mild pain.      Marland Kitchen. oxyCODONE-acetaminophen (PERCOCET/ROXICET) 5-325 MG per tablet Take 2 tablets by mouth every 4 (four) hours as needed for moderate pain or severe pain.  10 tablet  0  . Prenatal Vit-Fe Fumarate-FA (PRENATAL MULTIVITAMIN) TABS tablet Take 1 tablet by mouth daily at 12 noon.      . promethazine (PHENERGAN) 25 MG tablet Take 1 tablet (25 mg total) by mouth every 6 (six) hours as needed for nausea or vomiting.  30 tablet  0    Review of Systems  Constitutional: Negative.   HENT: Negative.   Eyes: Negative.   Respiratory: Negative.   Cardiovascular: Negative.   Gastrointestinal: Positive for abdominal pain.  Genitourinary: Negative.   Musculoskeletal: Negative.   Skin: Negative.   Neurological: Negative.   Psychiatric/Behavioral: Negative.    Physical Exam   Blood pressure 143/82, pulse 78, temperature 99.4 F (37.4  C), temperature source Oral, resp. rate 18, height 5\' 5"  (1.651 m), weight 98.249 kg (216 lb 9.6 oz), last menstrual period 07/07/2014, SpO2 99.00%.  Physical Exam  Constitutional: She is oriented to person, place, and time. She appears well-developed and well-nourished.  Pain 5/10  HENT:  Head: Normocephalic and atraumatic.  Eyes: Conjunctivae are normal. Pupils are equal, round, and reactive to light.  Cardiovascular: Normal rate, regular rhythm and normal heart sounds.   Respiratory: Effort normal and breath sounds normal.  GI: There is tenderness.  Genitourinary:  Not examined  Musculoskeletal: Normal range of motion.  Neurological: She is alert and oriented to person, place, and time.  Skin: Skin is warm and dry.  Psychiatric: She has a normal mood and affect. Her behavior is normal. Judgment and thought content normal.   [place] Ob Transvaginal  10/02/2014   CLINICAL DATA:  Threatened abortion. Pelvic pain. Continued vaginal bleeding.  EXAM: TRANSVAGINAL OB ULTRASOUND  TECHNIQUE: Transvaginal ultrasound was performed for complete evaluation of the gestation as well as the maternal uterus, adnexal regions, and pelvic cul-de-sac.  COMPARISON:  09/28/2014  FINDINGS: Intrauterine gestational sac: Abnormally shaped intrauterine gestational sac has now migrated further into the lower uterine segment of the endometrial cavity.  Yolk sac:  Not visualized  Embryo:  Not visualized  MSD: 20  mm   7 w   0  d  Maternal  uterus/adnexae: Both ovaries are normal in appearance. No adnexal mass or free fluid identified.  IMPRESSION: Persistent abnormal appearing intrauterine gestational sac has now migrated further into the lower uterine segment, consistent with impending spontaneous abortion.   Electronically Signed   By: Myles Rosenthal M.D.   On: 10/02/2014 14:00   Results for orders placed during the hospital encounter of 10/02/14 (from the past 24 hour(s))  CBC     Status: Abnormal   Collection Time     10/02/14  2:33 PM      Result Value Ref Range   WBC 7.3  4.0 - 10.5 K/uL   RBC 3.51 (*) 3.87 - 5.11 MIL/uL   Hemoglobin 10.8 (*) 12.0 - 15.0 g/dL   HCT 69.6 (*) 29.5 - 28.4 %   MCV 89.7  78.0 - 100.0 fL   MCH 30.8  26.0 - 34.0 pg   MCHC 34.3  30.0 - 36.0 g/dL   RDW 13.2  44.0 - 10.2 %   Platelets 176  150 - 400 K/uL     MAU Course  Procedures  MDM U/S CBC Dilaudid 1 mg/ pain improved to 1/10 Toradol 30 mg IV/ pain improved IVLR  Assessment and Plan   A: SAB  P; Will go for D&E at 4:30 with Dr Alexandra Curtis, Alexandra Curtis 10/02/2014, 2:18 PM

## 2014-10-02 NOTE — Op Note (Signed)
10/02/2014  5:49 PM  PATIENT:  Alexandra Curtis  33 y.o. female  PRE-OPERATIVE DIAGNOSIS:  Missed Abortion  POST-OPERATIVE DIAGNOSIS:  Missed Abortion  PROCEDURE:  Procedure(s): DILATATION AND EVACUATION WITH ULTRASOUND GUIDANCE  (N/A)  SURGEON:  Surgeon(s) and Role:    * Tresia Revolorio J. Richardson Doppole, MD - Primary  PHYSICIAN ASSISTANT:   ASSISTANTS: none   ANESTHESIA:   IV sedation  EBL:  Total I/O In: 800 [I.V.:800] Out: 150 [Urine:100; Blood:50]  BLOOD ADMINISTERED:none  DRAINS: none   LOCAL MEDICATIONS USED:  MARCAINE     SPECIMEN:  Source of Specimen:  products of conception   DISPOSITION OF SPECIMEN:  PATHOLOGY  COUNTS:  YES  TOURNIQUET:  * No tourniquets in log *  DICTATION: .Dragon Dictation  PLAN OF CARE: Discharge to home after PACU  PATIENT DISPOSITION:  PACU - hemodynamically stable.   Delay start of Pharmacological VTE agent (>24hrs) due to surgical blood loss or risk of bleeding: not applicable  Procedure: Patient was taken to the operating room where she was placed under general anesthesia. She was placed in the dorsal lithotomy position. She was prepped and draped in the usual sterile fashion. A speculum was placed into the vaginal vault. The anterior lip of the cervix was grasped with a single-tooth tenaculum. 10 cc of Quarter percent Marcaine was injected at the 4 and 8:00 positions of the cervix. The cervix was then sounded to 8 cm. The cervix was dilated to approximately 8 mm. The suction currette was inserted and products of conception were removed without difficulty.This was performed under ultrasound guidance. All tissue appeared to be removed.  The single-tooth tenaculum was removed from the anterior lip of the cervix. Excellent hemostasis was noted. The speculum was removed from the patient's vagina. She was awakened from anesthesia taken care to the recovery room awake and in stable condition. Sponge lap and needle counts were correct x2.  Glycine deficit was  25cc.

## 2014-10-02 NOTE — Anesthesia Preprocedure Evaluation (Signed)
Anesthesia Evaluation  Patient identified by MRN, date of birth, ID band Patient awake    Reviewed: Allergy & Precautions, H&P , NPO status , Patient's Chart, lab work & pertinent test results, reviewed documented beta blocker date and time   History of Anesthesia Complications Negative for: history of anesthetic complications  Airway Mallampati: II TM Distance: >3 FB Neck ROM: full    Dental  (+) Teeth Intact   Pulmonary neg pulmonary ROS, former smoker,  breath sounds clear to auscultation  Pulmonary exam normal       Cardiovascular negative cardio ROS  Rhythm:regular Rate:Normal     Neuro/Psych negative neurological ROS  negative psych ROS   GI/Hepatic negative GI ROS, Neg liver ROS,   Endo/Other  BMI 36.1  Renal/GU negative Renal ROS  negative genitourinary   Musculoskeletal   Abdominal   Peds  Hematology  (+) anemia ,   Anesthesia Other Findings Last ate 10/01/14 pm Had water this morning  Reproductive/Obstetrics (+) Pregnancy (12 weeks, missed AB)                           Anesthesia Physical Anesthesia Plan  ASA: II  Anesthesia Plan: MAC   Post-op Pain Management:    Induction:   Airway Management Planned:   Additional Equipment:   Intra-op Plan:   Post-operative Plan:   Informed Consent: I have reviewed the patients History and Physical, chart, labs and discussed the procedure including the risks, benefits and alternatives for the proposed anesthesia with the patient or authorized representative who has indicated his/her understanding and acceptance.   Dental Advisory Given  Plan Discussed with: CRNA and Surgeon  Anesthesia Plan Comments:         Anesthesia Quick Evaluation

## 2014-10-02 NOTE — Discharge Instructions (Addendum)
DISCHARGE INSTRUCTIONS: D&C / D&E The following instructions have been prepared to help you care for yourself upon your return home.   Personal hygiene:  Use sanitary pads for vaginal drainage, not tampons.  Shower the day after your procedure.  NO tub baths, pools or Jacuzzis for 2-3 weeks.  Wipe front to back after using the bathroom.  Activity and limitations:  Do NOT drive or operate any equipment for 24 hours. The effects of anesthesia are still present and drowsiness may result.  Do NOT rest in bed all day.  Walking is encouraged.  Walk up and down stairs slowly.  You may resume your normal activity in one to two days or as indicated by your physician.  Sexual activity: NO intercourse for at least 2 weeks after the procedure, or as indicated by your physician.  Diet: Eat a light meal as desired this evening. You may resume your usual diet tomorrow.  Return to work: You may resume your work activities in one to two days or as indicated by your doctor.  What to expect after your surgery: Expect to have vaginal bleeding/discharge for 2-3 days and spotting for up to 10 days. It is not unusual to have soreness for up to 1-2 weeks. You may have a slight burning sensation when you urinate for the first day. Mild cramps may continue for a couple of days. You may have a regular period in 2-6 weeks.  Call your doctor for any of the following:  Excessive vaginal bleeding, saturating and changing one pad every hour.  Inability to urinate 6 hours after discharge from hospital.  Pain not relieved by pain medication.  Fever of 100.4 F or greater.  Unusual vaginal discharge or odor.   Call for an appointment:    Patients signature: ______________________  Nurses signature ________________________  Support person's signature_______________________    DO NOT TAKE ANY IBUPROFEN PRODUCTS UNTIL 9 PM TONIGHT.  YOU RECEIVED A SIMILAR DOSE IN MAU IN THE HOSPITAL.

## 2014-10-02 NOTE — Progress Notes (Signed)
To OR

## 2014-10-02 NOTE — H&P (Signed)
Chief Complaint(s):   pelvic pain and missed AB / History and physical   HPI:  General 33 y/o presents as a new patient complaining of pelvic pain . [facility] ob/ gyn and diagnosed with missed AB. HEr quant was 16K repeat on 10/5 was 13K. She began having intense pain this morning. She has not noticed the passage of any tissue.  Current Medication:  Taking  Prenatal Tablet 1 tab once a day   Medical History:   obesity     herpes 2   Allergies/Intolerance:   N.K.D.A.   Gyn History:   Sexual activity currently sexually active. Periods : every month. LMP 07/07/14. Denies Birth control. Last pap smear date 04/2013 Normal. Denies Last mammogram date. STD HSV, TRICH.   OB History:   Pregnancy # 1 live birth, vaginal delivery. Pregnancy # 2 live birth, vaginal delivery. Pregnancy # 3 live birth, vaginal delivery.   Surgical History:   Denies Past Surgical History   Hospitalization:  Family History:   Father: alive, Hypertension, stroke 65(58), diagnosed with HTN, CVA    Mother: alive, Hypertension, diagnosed with HTN    Paternal Grand Mother: alive, Diabetes, diagnosed with DM    Maternal aunt: Breast cancer 42(50s), diagnosed with Breast Ca    Non-Contributory  Social History:  General Tobacco use cigarettes: Former smoker, Quit in year 07/27/14, Tobacco history last updated 08/23/2014.  no Smoking.  Alcohol: no.  Recreational drug use: no.  Exercise: 2-3 x weekly.  Occupation: works at [employer].  Marital Status: married.  Children: 3 Donalee Citrin(Nautica, Dionicia Ablerale, Darnell).  Religion: Holiness.  Seat belt use: yes.  ROS: Negative except as stated in HPI Negative except as stated in HPI.   Objective:  Vitals:  Wt 220, Wt change 3 lb, Ht 64.5, BMI 37.18, BP sitting 128/84  Past Results:  Examination:  General Examination alert, oriented, NAD" label="GENERAL APPEARANCE" categoryPropId="[id number]" examid="[id number]"GENERAL APPEARANCE alert, oriented, NAD.    clear to auscultation bilaterally" label="LUNGS:" categoryPropId="87" examid="[id number]"LUNGS: clear to auscultation bilaterally.  regular rate and rhythm" label="HEART:" categoryPropId="86" examid="[id number]"HEART: regular rate and rhythm.  soft, tender in lower pelvis bilaterally on rebound no guarding. /non-distended, bowel sounds present" label="ABDOMEN:" categoryPropId="88" examid="[id number]"ABDOMEN: soft, tender in lower pelvis bilaterally on rebound no guarding. /non-distended, bowel sounds present.  normal external genitalia, labia - unremarkable, vagina - pink moist mucosa, no lesions or abnormal discharge... vaginal blood in vault minimal , cervix - closed no discharge or lesions .. tender , adnexa - no masses or tenderness, uterus -tender to palpatoin " label="FEMALE GENITOURINARY:" categoryPropId="[id number]" examid="[id number]"FEMALE GENITOURINARY: normal external genitalia, labia - unremarkable, vagina - pink moist mucosa, no lesions or abnormal discharge... vaginal blood in vault minimal , cervix - closed no discharge or lesions .. tender , adnexa - no masses or tenderness, uterus -tender to palpatoin .  Physical Examination:   Assessment:  Assessment:  Incomplete abortion - O03.4 (Primary)     Plan:  Treatment:  Incomplete abortion  Imaging:US PREG UTERUS TRANSVAG  Notes: pt desires dilation and currettage. D/w pt all options. r/b/a of D&C discussed including but not limited to infection/ bleeding/ perforation of the uterus with the need for further surgery. R/O endometrial scarring. Pt voiced understanding and desires to proceed.  .. counselling and coordinationof care over 45 minutes.  Procedures:  Immunizations:  Therapeutic Injections:  Diagnostic Imaging:  Lab Reports:  Preventive Medicine:    Next Appointment:   plan D&C today

## 2014-10-02 NOTE — Anesthesia Postprocedure Evaluation (Signed)
  Anesthesia Post-op Note  Anesthesia Post Note  Patient: Alexandra Curtis  Procedure(s) Performed: Procedure(s) (LRB): DILATATION AND EVACUATION WITH ULTRASOUND GUIDANCE  (N/A)  Anesthesia type: MAC  Patient location: PACU  Post pain: Pain level controlled  Post assessment: Post-op Vital signs reviewed  Last Vitals:  Filed Vitals:   10/02/14 1900  BP: 151/100  Pulse: 78  Temp: 37 C  Resp: 16    Post vital signs: Reviewed  Level of consciousness: sedated  Complications: No apparent anesthesia complications

## 2014-10-03 ENCOUNTER — Encounter (HOSPITAL_COMMUNITY): Payer: Self-pay | Admitting: Obstetrics and Gynecology

## 2014-10-03 NOTE — MAU Provider Note (Signed)
Attestation of Attending Supervision of Advanced Practitioner (CNM/NP): Evaluation and management procedures were performed by the Advanced Practitioner under my supervision and collaboration. I have reviewed the Advanced Practitioner's note and chart, and I agree with the management and plan.  Xiao Graul H. 10:16 AM   

## 2014-10-28 ENCOUNTER — Encounter (HOSPITAL_COMMUNITY): Payer: Self-pay | Admitting: Obstetrics and Gynecology

## 2014-11-19 ENCOUNTER — Other Ambulatory Visit (HOSPITAL_COMMUNITY)
Admission: RE | Admit: 2014-11-19 | Discharge: 2014-11-19 | Disposition: A | Discharge status: Home / Self Care | Payer: Managed Care, Other (non HMO) | Source: Ambulatory Visit | Attending: Obstetrics and Gynecology | Admitting: Obstetrics and Gynecology

## 2014-11-19 ENCOUNTER — Other Ambulatory Visit: Payer: Self-pay | Admitting: Obstetrics and Gynecology

## 2014-11-19 DIAGNOSIS — Z01419 Encounter for gynecological examination (general) (routine) without abnormal findings: Secondary | ICD-10-CM | POA: Diagnosis present

## 2014-11-19 DIAGNOSIS — Z1151 Encounter for screening for human papillomavirus (HPV): Secondary | ICD-10-CM | POA: Insufficient documentation

## 2014-11-26 LAB — CYTOLOGY - PAP

## 2014-12-02 ENCOUNTER — Other Ambulatory Visit: Payer: Self-pay | Admitting: Obstetrics and Gynecology

## 2014-12-03 ENCOUNTER — Ambulatory Visit (HOSPITAL_COMMUNITY): Payer: Managed Care, Other (non HMO) | Admitting: Anesthesiology

## 2014-12-03 ENCOUNTER — Other Ambulatory Visit: Payer: Self-pay | Admitting: Obstetrics and Gynecology

## 2014-12-03 ENCOUNTER — Encounter (HOSPITAL_COMMUNITY): Payer: Self-pay

## 2014-12-03 ENCOUNTER — Ambulatory Visit (HOSPITAL_COMMUNITY)
Admission: RE | Admit: 2014-12-03 | Discharge: 2014-12-03 | Disposition: A | Discharge status: Home / Self Care | Payer: Managed Care, Other (non HMO) | Source: Ambulatory Visit | Attending: Obstetrics and Gynecology | Admitting: Obstetrics and Gynecology

## 2014-12-03 ENCOUNTER — Encounter (HOSPITAL_COMMUNITY)
Admission: RE | Disposition: A | Discharge status: Home / Self Care | Payer: Self-pay | Source: Ambulatory Visit | Attending: Obstetrics and Gynecology

## 2014-12-03 DIAGNOSIS — I10 Essential (primary) hypertension: Secondary | ICD-10-CM | POA: Insufficient documentation

## 2014-12-03 DIAGNOSIS — R938 Abnormal findings on diagnostic imaging of other specified body structures: Secondary | ICD-10-CM | POA: Insufficient documentation

## 2014-12-03 DIAGNOSIS — N939 Abnormal uterine and vaginal bleeding, unspecified: Secondary | ICD-10-CM | POA: Insufficient documentation

## 2014-12-03 DIAGNOSIS — Z87891 Personal history of nicotine dependence: Secondary | ICD-10-CM | POA: Diagnosis not present

## 2014-12-03 DIAGNOSIS — Z6836 Body mass index (BMI) 36.0-36.9, adult: Secondary | ICD-10-CM | POA: Insufficient documentation

## 2014-12-03 HISTORY — DX: Essential (primary) hypertension: I10

## 2014-12-03 HISTORY — DX: Headache, unspecified: R51.9

## 2014-12-03 HISTORY — DX: Headache: R51

## 2014-12-03 HISTORY — PX: HYSTEROSCOPY WITH D & C: SHX1775

## 2014-12-03 LAB — CBC
HCT: 34.9 % — ABNORMAL LOW (ref 36.0–46.0)
HEMOGLOBIN: 11.7 g/dL — AB (ref 12.0–15.0)
MCH: 30.9 pg (ref 26.0–34.0)
MCHC: 33.5 g/dL (ref 30.0–36.0)
MCV: 92.1 fL (ref 78.0–100.0)
Platelets: 186 10*3/uL (ref 150–400)
RBC: 3.79 MIL/uL — ABNORMAL LOW (ref 3.87–5.11)
RDW: 12.6 % (ref 11.5–15.5)
WBC: 4.3 10*3/uL (ref 4.0–10.5)

## 2014-12-03 LAB — PREGNANCY, URINE: PREG TEST UR: NEGATIVE

## 2014-12-03 SURGERY — DILATATION AND CURETTAGE /HYSTEROSCOPY
Anesthesia: Epidural | Site: Vagina

## 2014-12-03 MED ORDER — GLYCOPYRROLATE 0.2 MG/ML IJ SOLN
INTRAMUSCULAR | Status: DC | PRN
  Administered 2014-12-03: 0.2 mg via INTRAVENOUS

## 2014-12-03 MED ORDER — HYDROCHLOROTHIAZIDE 12.5 MG PO TABS: 25.0000 mg | ORAL_TABLET | Freq: Every day | ORAL | Status: DC

## 2014-12-03 MED ORDER — FENTANYL CITRATE 0.05 MG/ML IJ SOLN: 25.0000 ug | INTRAMUSCULAR | Status: DC | PRN

## 2014-12-03 MED ORDER — MEPERIDINE HCL 25 MG/ML IJ SOLN: 6.2500 mg | INTRAMUSCULAR | Status: DC | PRN

## 2014-12-03 MED ORDER — FENTANYL CITRATE 0.05 MG/ML IJ SOLN
INTRAMUSCULAR | Status: AC
  Filled 2014-12-03: qty 5

## 2014-12-03 MED ORDER — HYDROCHLOROTHIAZIDE 25 MG PO TABS: 25.0000 mg | ORAL_TABLET | Freq: Every day | ORAL | Status: AC

## 2014-12-03 MED ORDER — IBUPROFEN 800 MG PO TABS: 800.0000 mg | ORAL_TABLET | Freq: Three times a day (TID) | ORAL | Status: DC | PRN

## 2014-12-03 MED ORDER — BUPIVACAINE HCL (PF) 0.25 % IJ SOLN
INTRAMUSCULAR | Status: AC
  Filled 2014-12-03: qty 30

## 2014-12-03 MED ORDER — SCOPOLAMINE 1 MG/3DAYS TD PT72
1.0000 | MEDICATED_PATCH | Freq: Once | TRANSDERMAL | Status: DC
  Administered 2014-12-03: 1.5 mg via TRANSDERMAL

## 2014-12-03 MED ORDER — HYDROCODONE-ACETAMINOPHEN 5-325 MG PO TABS: 1.0000 | ORAL_TABLET | Freq: Four times a day (QID) | ORAL | Status: DC | PRN

## 2014-12-03 MED ORDER — ONDANSETRON HCL 4 MG/2ML IJ SOLN
INTRAMUSCULAR | Status: AC
  Filled 2014-12-03: qty 2

## 2014-12-03 MED ORDER — FENTANYL CITRATE 0.05 MG/ML IJ SOLN
INTRAMUSCULAR | Status: DC | PRN
  Administered 2014-12-03 (×2): 50 ug via INTRAVENOUS
  Administered 2014-12-03: 100 ug via INTRAVENOUS

## 2014-12-03 MED ORDER — SCOPOLAMINE 1 MG/3DAYS TD PT72
MEDICATED_PATCH | TRANSDERMAL | Status: AC
  Administered 2014-12-03: 1.5 mg via TRANSDERMAL
  Filled 2014-12-03: qty 1

## 2014-12-03 MED ORDER — GLYCINE 1.5 % IR SOLN
Status: DC | PRN
  Administered 2014-12-03: 3000 mL

## 2014-12-03 MED ORDER — KETOROLAC TROMETHAMINE 30 MG/ML IJ SOLN: 15.0000 mg | Freq: Once | INTRAMUSCULAR | Status: DC | PRN

## 2014-12-03 MED ORDER — LIDOCAINE HCL (CARDIAC) 20 MG/ML IV SOLN
INTRAVENOUS | Status: DC | PRN
  Administered 2014-12-03: 80 mg via INTRAVENOUS

## 2014-12-03 MED ORDER — DEXAMETHASONE SODIUM PHOSPHATE 4 MG/ML IJ SOLN
INTRAMUSCULAR | Status: DC | PRN
  Administered 2014-12-03: 4 mg via INTRAVENOUS

## 2014-12-03 MED ORDER — DEXAMETHASONE SODIUM PHOSPHATE 10 MG/ML IJ SOLN
INTRAMUSCULAR | Status: AC
  Filled 2014-12-03: qty 1

## 2014-12-03 MED ORDER — PROPOFOL 10 MG/ML IV BOLUS
INTRAVENOUS | Status: DC | PRN
  Administered 2014-12-03: 150 mg via INTRAVENOUS

## 2014-12-03 MED ORDER — PROMETHAZINE HCL 25 MG/ML IJ SOLN: 6.2500 mg | INTRAMUSCULAR | Status: DC | PRN

## 2014-12-03 MED ORDER — MIDAZOLAM HCL 2 MG/2ML IJ SOLN
INTRAMUSCULAR | Status: DC | PRN
  Administered 2014-12-03: 2 mg via INTRAVENOUS

## 2014-12-03 MED ORDER — SILVER NITRATE-POT NITRATE 75-25 % EX MISC
CUTANEOUS | Status: DC | PRN
Start: 2014-12-03 — End: 2014-12-03
  Administered 2014-12-03: 4 via TOPICAL

## 2014-12-03 MED ORDER — LIDOCAINE HCL (CARDIAC) 20 MG/ML IV SOLN
INTRAVENOUS | Status: AC
  Filled 2014-12-03: qty 5

## 2014-12-03 MED ORDER — LACTATED RINGERS IV SOLN
INTRAVENOUS | Status: DC
  Administered 2014-12-03 (×2): via INTRAVENOUS

## 2014-12-03 MED ORDER — KETOROLAC TROMETHAMINE 30 MG/ML IJ SOLN
INTRAMUSCULAR | Status: DC | PRN
  Administered 2014-12-03: 30 mg via INTRAVENOUS

## 2014-12-03 MED ORDER — MIDAZOLAM HCL 2 MG/2ML IJ SOLN
INTRAMUSCULAR | Status: AC
  Filled 2014-12-03: qty 2

## 2014-12-03 MED ORDER — PROPOFOL 10 MG/ML IV EMUL
INTRAVENOUS | Status: AC
  Filled 2014-12-03: qty 20

## 2014-12-03 MED ORDER — ONDANSETRON HCL 4 MG/2ML IJ SOLN
INTRAMUSCULAR | Status: DC | PRN
  Administered 2014-12-03: 4 mg via INTRAVENOUS

## 2014-12-03 MED ORDER — BUPIVACAINE HCL (PF) 0.25 % IJ SOLN
INTRAMUSCULAR | Status: DC | PRN
  Administered 2014-12-03: 20 mL

## 2014-12-03 SURGICAL SUPPLY — 17 items
CANISTER SUCT 3000ML (MISCELLANEOUS) ×3 IMPLANT
CATH ROBINSON RED A/P 16FR (CATHETERS) ×3 IMPLANT
CLOTH BEACON ORANGE TIMEOUT ST (SAFETY) ×3 IMPLANT
CONTAINER PREFILL 10% NBF 60ML (FORM) ×3 IMPLANT
ELECT REM PT RETURN 9FT ADLT (ELECTROSURGICAL) ×3
ELECTRODE REM PT RTRN 9FT ADLT (ELECTROSURGICAL) ×1 IMPLANT
GLOVE BIOGEL M 6.5 STRL (GLOVE) ×6 IMPLANT
GLOVE BIOGEL PI IND STRL 6.5 (GLOVE) ×1 IMPLANT
GLOVE BIOGEL PI INDICATOR 6.5 (GLOVE) ×2
GOWN STRL REUS W/TWL LRG LVL3 (GOWN DISPOSABLE) ×9 IMPLANT
LOOP ANGLED CUTTING 22FR (CUTTING LOOP) IMPLANT
PACK VAGINAL MINOR WOMEN LF (CUSTOM PROCEDURE TRAY) ×3 IMPLANT
PAD OB MATERNITY 4.3X12.25 (PERSONAL CARE ITEMS) ×3 IMPLANT
TOWEL OR 17X24 6PK STRL BLUE (TOWEL DISPOSABLE) ×6 IMPLANT
TUBING AQUILEX INFLOW (TUBING) ×3 IMPLANT
TUBING AQUILEX OUTFLOW (TUBING) ×3 IMPLANT
WATER STERILE IRR 1000ML POUR (IV SOLUTION) ×3 IMPLANT

## 2014-12-03 NOTE — Discharge Instructions (Signed)

## 2014-12-03 NOTE — H&P (Signed)
Chief Complaint(s):   US/PELVIC/ HISTORY AND PHYSICAL   HPI:  General 33 y/o presents to discuss ultrasound results to evaluate abnormal uterine bleeding. . she started a menses 11/07/2014. She has had spotting daily since. Her ultrasound today shows a 10.2 cm x 6.7 cm x 6.3 cm uterus . the endometrium is thickened 1.5 cm. It is iregular in appearance fundally. with ? hypoechoic area 1.2 cm vx blood.. no blood flow is noted. the right ovary is normal. the left ovary has a complex cyst 3.5 cm. appears hemorrhagia and avascular.  Current Medication:  Taking  Prenatal Tablet 1 tab once a day     Medication List reviewed and reconciled with the patient   Medical History:   obesity    Hypertension   herpes 2   Allergies/Intolerance:   N.K.D.A.   Gyn History:   Sexual activity currently sexually active. Periods : every month. LMP 11/25/14 off and on until today 11/27/14. Denies Birth control none. Last pap smear date 05/16/2013 Normal. Denies Last mammogram date. STD HSV, TRICH.   OB History:   Number of pregnancies 5. miscarriages 1. Pregnancy # 1 miscarriage. Pregnancy # 2 live birth, vaginal delivery. Pregnancy # 3 live birth, vaginal delivery. Pregnancy # 4: live birth, vaginal delivery. Pregnancy # 5: Miscarriage.   Surgical History:   D&C 10/03/14   Hospitalization:   child birth   Family History:   Father: alive, Hypertension, stroke 56(58), diagnosed with HTN, CVA    Mother: alive, Hypertension, diagnosed with HTN    Paternal Grand Mother: alive, Diabetes, diagnosed with DM    Maternal aunt: Breast cancer 60(50s), diagnosed with Breast Ca  Social History:  General Tobacco use cigarettes: Former smoker, Quit in year 07/27/14, Tobacco history last updated 11/27/2014.  no Smoking.  Alcohol: no.  Caffeine: yes, coffee, soda, tea.  Recreational drug use: no.  Exercise: yes, 2-3 x weekly.  Occupation: works at [employer].  Marital Status: married.  Children: 3 Donalee Citrin(Nautica, Dionicia Ablerale,  Darnell).  Religion: Holiness.  Seat belt use: yes.  ROS: Negative except as stated in HPI.   Objective:  Vitals:  Wt 218, Ht 64.5, BMI 36.84, Pulse sitting 64, BP sitting 156/102  Past Results:  Examination:  General Examination alert, oriented, NAD" label="GENERAL APPEARANCE" categoryPropId="10089" examid="[id number]"GENERAL APPEARANCE alert, oriented, NAD.  clear to auscultation bilaterally" label="LUNGS:" categoryPropId="87" examid="[id number]"LUNGS: clear to auscultation bilaterally.  regular rate and rhythm" label="HEART:" categoryPropId="86" examid="[id number]"HEART: regular rate and rhythm.  soft, non-tender/non-distended, bowel sounds present" label="ABDOMEN:" categoryPropId="88" examid="[id number]"ABDOMEN: soft, non-tender/non-distended, bowel sounds present.  normal external genitalia, labia - unremarkable, vagina - pink moist mucosa, no lesions or abnormal discharge, cervix - no discharge or lesions or CMT, adnexa - no masses or tenderness, uterus - nontender and normal size on palpation" label="FEMALE GENITOURINARY:" categoryPropId="[id number]" examid="[id number]"FEMALE GENITOURINARY: normal external genitalia, labia - unremarkable, vagina - pink moist mucosa, no lesions or abnormal discharge, cervix - no discharge or lesions or CMT, adnexa - no masses or tenderness, uterus - nontender and normal size on palpation.  no edema present" label="EXTREMITIES:" categoryPropId="89" examid="[id number]"EXTREMITIES: no edema present.  Physical Examination:   Assessment:  Assessment:  Abnormal uterine bleeding - N93.9 (Primary)     Thickened endometrium - R93.8     Essential hypertension - I10     Plan:  Treatment:  Abnormal uterine bleeding  Imaging:US ECHO TRANSVAGINAL  Notes: discussed with patient options of progesterone only ocp vs SHG vs hysteroscopy D&C with possible polypectomy.... pt desires management with hysteroscopy D&C possible polypectomy.. r/b/a of  surgery discussed with the patient including  but not limited to infection bleeding. perforation of the uterus with the need for further surgery.   Essential hypertension.. Pt started on Procardia XL 30 mg daily.. She is to follow up with her pcp

## 2014-12-03 NOTE — Anesthesia Procedure Notes (Signed)
Procedure Name: LMA Insertion Date/Time: 12/03/2014 1:40 PM Performed by: Graciela HusbandsFUSSELL, Angelise Petrich O Pre-anesthesia Checklist: Patient identified, Patient being monitored, Timeout performed, Emergency Drugs available and Suction available Patient Re-evaluated:Patient Re-evaluated prior to inductionOxygen Delivery Method: Circle system utilized Preoxygenation: Pre-oxygenation with 100% oxygen Intubation Type: IV induction LMA: LMA inserted LMA Size: 4.0 Number of attempts: 1 Placement Confirmation: positive ETCO2 and breath sounds checked- equal and bilateral Tube secured with: Tape Dental Injury: Teeth and Oropharynx as per pre-operative assessment

## 2014-12-03 NOTE — Anesthesia Preprocedure Evaluation (Signed)
Anesthesia Evaluation  Patient identified by MRN, date of birth, ID band Patient awake    Reviewed: Allergy & Precautions, H&P , Patient's Chart, lab work & pertinent test results  Airway Mallampati: II  TM Distance: >3 FB Neck ROM: full    Dental  (+) Teeth Intact   Pulmonary former smoker,  breath sounds clear to auscultation        Cardiovascular hypertension, On Medications Rhythm:regular Rate:Normal     Neuro/Psych    GI/Hepatic   Endo/Other  Morbid obesity  Renal/GU      Musculoskeletal   Abdominal   Peds  Hematology   Anesthesia Other Findings       Reproductive/Obstetrics (+) Pregnancy                             Anesthesia Physical Anesthesia Plan  ASA: III  Anesthesia Plan: Epidural   Post-op Pain Management:    Induction: Intravenous  Airway Management Planned: LMA  Additional Equipment:   Intra-op Plan:   Post-operative Plan:   Informed Consent: I have reviewed the patients History and Physical, chart, labs and discussed the procedure including the risks, benefits and alternatives for the proposed anesthesia with the patient or authorized representative who has indicated his/her understanding and acceptance.   Dental Advisory Given and Dental advisory given  Plan Discussed with: CRNA and Surgeon  Anesthesia Plan Comments: (Discussed GA with LMA, possible sore throat, potential need to switch to ETT, N/V, pulmonary aspiration. Questions answered. )        Anesthesia Quick Evaluation

## 2014-12-03 NOTE — H&P (Signed)
Date of Initial H&P: 12/03/2104 History reviewed, patient examined, no change in status, stable for surgery.

## 2014-12-03 NOTE — Anesthesia Postprocedure Evaluation (Signed)
  Anesthesia Post-op Note  Anesthesia Post Note  Patient: Alexandra Curtis  Procedure(s) Performed: Procedure(s) (LRB): DILATATION AND CURETTAGE /HYSTEROSCOPY (N/A)  Anesthesia type: General  Patient location: PACU  Post pain: Pain level controlled  Post assessment: Post-op Vital signs reviewed  Last Vitals:  Filed Vitals:   12/03/14 1445  BP: 159/87  Pulse: 65  Temp:   Resp: 16    Post vital signs: Reviewed  Level of consciousness: sedated  Complications: No apparent anesthesia complications

## 2014-12-03 NOTE — Transfer of Care (Signed)
Immediate Anesthesia Transfer of Care Note  Patient: Alexandra GuileMarkesha Curtis  Procedure(s) Performed: Procedure(s): DILATATION AND CURETTAGE /HYSTEROSCOPY (N/A)  Patient Location: PACU  Anesthesia Type:General  Level of Consciousness: awake, alert  and oriented  Airway & Oxygen Therapy: Patient Spontanous Breathing and Patient connected to nasal cannula oxygen  Post-op Assessment: Report given to PACU RN and Post -op Vital signs reviewed and stable  Post vital signs: Reviewed and stable  Complications: No apparent anesthesia complications

## 2014-12-03 NOTE — Op Note (Signed)
12/03/2014  2:20 PM  PATIENT:  Alexandra Curtis  33 y.o. female  PRE-OPERATIVE DIAGNOSIS:  N93.9  Abnormal Uterine Bleeding R93.8  Thickened Endometrium  POST-OPERATIVE DIAGNOSIS:  Abnormal Uterine Bleeding R93.8  Thickened Endometrium  PROCEDURE:  Procedure(s): DILATATION AND CURETTAGE /HYSTEROSCOPY (N/A)  SURGEON:  Surgeon(s) and Role:    * Anadelia Kintz J. Richardson Doppole, MD - Primary  PHYSICIAN ASSISTANT: none   ASSISTANTS: none   ANESTHESIA:   general  EBL:  Total I/O In: 1400 [I.V.:1400] Out: 200 [Urine:200]  BLOOD ADMINISTERED:none  DRAINS: none   LOCAL MEDICATIONS USED:  MARCAINE     SPECIMEN:  Source of Specimen:  endometrial currettings  DISPOSITION OF SPECIMEN:  PATHOLOGY  COUNTS:  YES  TOURNIQUET:  * No tourniquets in log *  DICTATION: .Dragon Dictation  PLAN OF CARE: Discharge to home after PACU  PATIENT DISPOSITION:  PACU - hemodynamically stable.   Delay start of Pharmacological VTE agent (>24hrs) due to surgical blood loss or risk of bleeding: not applicable  Findings: Normal appearing proliferative endometrium. No masses were seen.   Procedure: Patient was taken to the operating room where she was placed under general anesthesia. She was placed in the dorsal lithotomy position. She was prepped and draped in the usual sterile fashion. A speculum was placed into the vaginal vault. The anterior lip of the cervix was grasped with a single-tooth tenaculum. Quarter percent Marcaine was injected at the 4 and 8:00 positions of the cervix. The uterus was then sounded to 9 cm. The cervix was dilated to approximately 4 mm. Diagnostic hysteroscope was inserted. The findings noted above. The hysteroscope was removed.. A Sharp curet was introduced and endometrial curettings were obtained. The hysteroscope was then reinserted. There was no evidence of perforation. Hysteroscope was then removed. The single-tooth tenaculum was removed from the anterior lip of the cervix. Bleeding was  noted from the anterior lip of the cervix. Silver Nitrate was applied.  Excellent hemostasis was noted. The speculum was removed from the patient's vagina. She was awakened from anesthesia taken care to the recovery room awake and in stable condition. Sponge lap and needle counts were correct x2.  Glycine deficit was 150 cc.

## 2014-12-04 ENCOUNTER — Encounter (HOSPITAL_COMMUNITY): Payer: Self-pay | Admitting: Obstetrics and Gynecology

## 2015-08-03 ENCOUNTER — Encounter (HOSPITAL_COMMUNITY): Payer: Self-pay | Admitting: *Deleted

## 2016-01-31 ENCOUNTER — Encounter (HOSPITAL_COMMUNITY): Payer: Self-pay

## 2016-01-31 ENCOUNTER — Inpatient Hospital Stay (HOSPITAL_COMMUNITY)
Admission: AD | Admit: 2016-01-31 | Discharge: 2016-01-31 | Disposition: A | Discharge status: Home / Self Care | Payer: Managed Care, Other (non HMO) | Source: Ambulatory Visit | Attending: Obstetrics & Gynecology | Admitting: Obstetrics & Gynecology

## 2016-01-31 DIAGNOSIS — K5900 Constipation, unspecified: Secondary | ICD-10-CM | POA: Diagnosis not present

## 2016-01-31 DIAGNOSIS — N946 Dysmenorrhea, unspecified: Secondary | ICD-10-CM | POA: Diagnosis not present

## 2016-01-31 DIAGNOSIS — I10 Essential (primary) hypertension: Secondary | ICD-10-CM

## 2016-01-31 DIAGNOSIS — Z87891 Personal history of nicotine dependence: Secondary | ICD-10-CM | POA: Diagnosis not present

## 2016-01-31 DIAGNOSIS — Z3202 Encounter for pregnancy test, result negative: Secondary | ICD-10-CM | POA: Diagnosis not present

## 2016-01-31 DIAGNOSIS — N644 Mastodynia: Secondary | ICD-10-CM

## 2016-01-31 DIAGNOSIS — R109 Unspecified abdominal pain: Secondary | ICD-10-CM | POA: Diagnosis present

## 2016-01-31 LAB — CBC
HCT: 36.6 % (ref 36.0–46.0)
Hemoglobin: 12.5 g/dL (ref 12.0–15.0)
MCH: 30.6 pg (ref 26.0–34.0)
MCHC: 34.2 g/dL (ref 30.0–36.0)
MCV: 89.5 fL (ref 78.0–100.0)
PLATELETS: 163 10*3/uL (ref 150–400)
RBC: 4.09 MIL/uL (ref 3.87–5.11)
RDW: 13 % (ref 11.5–15.5)
WBC: 4.9 10*3/uL (ref 4.0–10.5)

## 2016-01-31 LAB — WET PREP, GENITAL
Sperm: NONE SEEN
Trich, Wet Prep: NONE SEEN
Yeast Wet Prep HPF POC: NONE SEEN

## 2016-01-31 LAB — URINALYSIS, ROUTINE W REFLEX MICROSCOPIC
Bilirubin Urine: NEGATIVE
Glucose, UA: NEGATIVE mg/dL
HGB URINE DIPSTICK: NEGATIVE
KETONES UR: 15 mg/dL — AB
LEUKOCYTES UA: NEGATIVE
Nitrite: NEGATIVE
PH: 6.5 (ref 5.0–8.0)
Protein, ur: NEGATIVE mg/dL
SPECIFIC GRAVITY, URINE: 1.02 (ref 1.005–1.030)

## 2016-01-31 LAB — HCG, SERUM, QUALITATIVE: Preg, Serum: NEGATIVE

## 2016-01-31 LAB — POCT PREGNANCY, URINE: PREG TEST UR: NEGATIVE

## 2016-01-31 NOTE — MAU Provider Note (Signed)
Chief Complaint: Abdominal Pain and Breast Pain   None     SUBJECTIVE HPI: Alexandra Curtis is a 35 y.o. G5P1013 at who presents to maternity admissions reporting breast tenderness bilaterally x 2-3 months, late menses by 1 day, and abdominal cramping x 2 days.  Pregnancy tests at home were negative x 3.  Patient's last menstrual period was 01/05/2016.  She reports her periods are usually 28 days long and have not change recently.  She does report constipation with bowel movement yesterday but it was difficult and she had to strain.  She reports diarrhea x 1-2 episodes 3 days ago.  She had a miscarriage last year and worries that she could be pregnant and not know it.  She denies vaginal bleeding, vaginal itching/burning, urinary symptoms, h/a, dizziness, n/v, or fever/chills.    She is not currently taking any BP medications but was prescribed  HCTZ in 2015.  She denies h/a, chest pain, or shortness of breath.  Abdominal Pain This is a new problem. The current episode started in the past 7 days. The onset quality is gradual. The problem occurs intermittently. The most recent episode lasted 2 days. The problem has been unchanged. The pain is located in the LLQ and RLQ. The pain is moderate. The quality of the pain is cramping. The abdominal pain does not radiate. Associated symptoms include constipation. Pertinent negatives include no diarrhea, dysuria, fever, frequency, headaches, nausea or vomiting. Nothing aggravates the pain. The pain is relieved by nothing. She has tried nothing for the symptoms.    Past Medical History  Diagnosis Date  . Medical history non-contributory   . Hypertension   . Headache     migranes   Past Surgical History  Procedure Laterality Date  . No past surgeries    . Dilation and evacuation N/A 10/02/2014    Procedure: DILATATION AND EVACUATION WITH ULTRASOUND GUIDANCE ;  Surgeon: Dorien Chihuahua. Richardson Dopp, MD;  Location: [facility];  Service: Gynecology;  Laterality: N/A;  .  Hysteroscopy w/d&c N/A 12/03/2014    Procedure: DILATATION AND CURETTAGE /HYSTEROSCOPY;  Surgeon: Dorien Chihuahua. Richardson Dopp, MD;  Location: [facility];  Service: Gynecology;  Laterality: N/A;   Social History   Social History  . Marital Status: Single    Spouse Name: N/A  . Number of Children: N/A  . Years of Education: N/A   Occupational History  . Not on file.   Social History Main Topics  . Smoking status: Former Smoker -- 0.20 packs/day    Types: Cigarettes  . Smokeless tobacco: Not on file  . Alcohol Use: No  . Drug Use: No  . Sexual Activity: Yes     Comment: 10/3   Other Topics Concern  . Not on file   Social History Narrative   No current facility-administered medications on file prior to encounter.   Current Outpatient Prescriptions on File Prior to Encounter  Medication Sig Dispense Refill  . hydrochlorothiazide (HYDRODIURIL) 25 MG tablet Take 1 tablet (25 mg total) by mouth daily. (Patient not taking: Reported on 01/31/2016) 14 tablet 0   No Known Allergies  ROS:  Review of Systems  Constitutional: Negative for fever, chills and fatigue.  Respiratory: Negative for shortness of breath.   Cardiovascular: Negative for chest pain.  Gastrointestinal: Positive for abdominal pain and constipation. Negative for nausea, vomiting and diarrhea.  Genitourinary: Positive for pelvic pain. Negative for dysuria, frequency, flank pain, vaginal bleeding, vaginal discharge, difficulty urinating and vaginal pain.  Neurological: Negative for dizziness and headaches.  Psychiatric/Behavioral: Negative.      I have reviewed patient's Past Medical Hx, Surgical Hx, Family Hx, Social Hx, medications and allergies.   Physical Exam   Patient Vitals for the past 24 hrs:  BP Temp Temp src Pulse Resp Height Weight  01/31/16 1617 162/96 mmHg - - 80 - - -  01/31/16 1353 160/93 mmHg 98.8 F (37.1 C) Oral 75 18 5' 3.75" (1.619 m) 230 lb (104.327 kg)   Constitutional: Well-developed, well-nourished  female in no acute distress.  Cardiovascular: normal rate Respiratory: normal effort GI: Abd soft, non-tender. Pos BS x 4 MS: Extremities nontender, no edema, normal ROM Neurologic: Alert and oriented x 4.  GU: Neg CVAT.  PELVIC EXAM: Cervix pink, visually closed, without lesion, scant pink/light red bleeding, vaginal walls and external genitalia normal Bimanual exam: Cervix 0/long/high, firm, anterior, neg CMT, uterus nontender, nonenlarged, adnexa without tenderness, enlargement, or mass   LAB RESULTS Results for orders placed or performed during the hospital encounter of 01/31/16 (from the past 24 hour(s))  Urinalysis, Routine w reflex microscopic (not at [facility])     Status: Abnormal   Collection Time: 01/31/16  2:00 PM  Result Value Ref Range   Color, Urine YELLOW YELLOW   APPearance CLEAR CLEAR   Specific Gravity, Urine 1.020 1.005 - 1.030   pH 6.5 5.0 - 8.0   Glucose, UA NEGATIVE NEGATIVE mg/dL   Hgb urine dipstick NEGATIVE NEGATIVE   Bilirubin Urine NEGATIVE NEGATIVE   Ketones, ur 15 (A) NEGATIVE mg/dL   Protein, ur NEGATIVE NEGATIVE mg/dL   Nitrite NEGATIVE NEGATIVE   Leukocytes, UA NEGATIVE NEGATIVE  Pregnancy, urine POC     Status: None   Collection Time: 01/31/16  2:04 PM  Result Value Ref Range   Preg Test, Ur NEGATIVE NEGATIVE  Wet prep, genital     Status: Abnormal   Collection Time: 01/31/16  2:40 PM  Result Value Ref Range   Yeast Wet Prep HPF POC NONE SEEN NONE SEEN   Trich, Wet Prep NONE SEEN NONE SEEN   Clue Cells Wet Prep HPF POC PRESENT (A) NONE SEEN   WBC, Wet Prep HPF POC FEW (A) NONE SEEN   Sperm NONE SEEN   CBC     Status: None   Collection Time: 01/31/16  3:10 PM  Result Value Ref Range   WBC 4.9 4.0 - 10.5 K/uL   RBC 4.09 3.87 - 5.11 MIL/uL   Hemoglobin 12.5 12.0 - 15.0 g/dL   HCT 45.4 09.8 - 11.9 %   MCV 89.5 78.0 - 100.0 fL   MCH 30.6 26.0 - 34.0 pg   MCHC 34.2 30.0 - 36.0 g/dL   RDW 14.7 82.9 - 56.2 %   Platelets 163 150 - 400 K/uL  hCG,  serum, qualitative     Status: None   Collection Time: 01/31/16  3:10 PM  Result Value Ref Range   Preg, Serum NEGATIVE NEGATIVE       IMAGING No results found.  MAU Management/MDM: Ordered labs and reviewed results.  No abnormalities on Gyn evaluation today.  With bleeding in vaginal vault, likely onset of menses.  Pt to follow up with Gyn provider, has seen Dr Richardson Dopp at [place] in teh past.  Needs to follow up with primary care regarding HTN.  Discussed with pt risks of elevated BP and need for close management.  Pt stable at time of discharge.  ASSESSMENT 1. Dysmenorrhea   2. Breast tenderness in female   3. Constipation, unspecified  constipation type     PLAN Discharge home    Follow-up Information    Follow up with Jessee Avers., MD.   Specialty:  Obstetrics and Gynecology   Why:  As needed, Return to MAU as needed for emergencies   Contact information:   [address] [place] [address] [phone]       Sharen Counter Certified Nurse-Midwife 01/31/2016  4:47 PM

## 2016-01-31 NOTE — MAU Note (Addendum)
Having lower abd pains, started a few days ago.  Normally well feel this when coming on,  Has had cycle..  Breast tenderness for over 2 months.  Has done 3 preg test- all have been negative.

## 2016-01-31 NOTE — Discharge Instructions (Signed)
Breast Tenderness Breast tenderness is a common problem for women of all ages. Breast tenderness may cause mild discomfort to severe pain. It has a variety of causes. Your health care provider will find out the likely cause of your breast tenderness by examining your breasts, asking you about symptoms, and ordering some tests. Breast tenderness usually does not mean you have breast cancer. HOME CARE INSTRUCTIONS  Breast tenderness often can be handled at home. You can try:  Getting fitted for a new bra that provides more support, especially during exercise.  Wearing a more supportive bra or sports bra while sleeping when your breasts are very tender.  If you have a breast injury, apply ice to the area:  Put ice in a plastic bag.  Place a towel between your skin and the bag.  Leave the ice on for 20 minutes, 2-3 times a day.  If your breasts are too full of milk as a result of breastfeeding, try:  Expressing milk either by hand or with a breast pump.  Applying a warm compress to the breasts for relief.  Taking over-the-counter pain relievers, if approved by your health care provider.  Taking other medicines that your health care provider prescribes. These may include antibiotic medicines or birth control pills. Over the long term, your breast tenderness might be eased if you:  Cut down on caffeine.  Reduce the amount of fat in your diet. Keep a log of the days and times when your breasts are most tender. This will help you and your health care provider find the cause of the tenderness and how to relieve it. Also, learn how to do breast exams at home. This will help you notice if you have an unusual growth or lump that could cause tenderness. SEEK MEDICAL CARE IF:   Any part of your breast is hard, red, and hot to the touch. This could be a sign of infection.  Fluid is coming out of your nipples (and you are not breastfeeding). Especially watch for blood or pus.  You have a fever  as well as breast tenderness.  You have a new or painful lump in your breast that remains after your menstrual period ends.  You have tried to take care of the pain at home, but it has not gone away.  Your breast pain is getting worse, or the pain is making it hard to do the things you usually do during your day.   This information is not intended to replace advice given to you by your health care provider. Make sure you discuss any questions you have with your health care provider.   Document Released: 11/25/2008 Document Revised: 08/15/2013 Document Reviewed: 07/12/2013 Elsevier Interactive Patient Education 2016 [employer].   Constipation, Adult Constipation is when a person has fewer than three bowel movements a week, has difficulty having a bowel movement, or has stools that are dry, hard, or larger than normal. As people grow older, constipation is more common. A low-fiber diet, not taking in enough fluids, and taking certain medicines may make constipation worse.  CAUSES   Certain medicines, such as antidepressants, pain medicine, iron supplements, antacids, and water pills.   Certain diseases, such as diabetes, irritable bowel syndrome (IBS), thyroid disease, or depression.   Not drinking enough water.   Not eating enough fiber-rich foods.   Stress or travel.   Lack of physical activity or exercise.   Ignoring the urge to have a bowel movement.   Using laxatives too  much.  SIGNS AND SYMPTOMS   Having fewer than three bowel movements a week.   Straining to have a bowel movement.   Having stools that are hard, dry, or larger than normal.   Feeling full or bloated.   Pain in the lower abdomen.   Not feeling relief after having a bowel movement.  DIAGNOSIS  Your health care provider will take a medical history and perform a physical exam. Further testing may be done for severe constipation. Some tests may include:  A barium enema X-ray to examine  your rectum, colon, and, sometimes, your small intestine.   A sigmoidoscopy to examine your lower colon.   A colonoscopy to examine your entire colon. TREATMENT  Treatment will depend on the severity of your constipation and what is causing it. Some dietary treatments include drinking more fluids and eating more fiber-rich foods. Lifestyle treatments may include regular exercise. If these diet and lifestyle recommendations do not help, your health care provider may recommend taking over-the-counter laxative medicines to help you have bowel movements. Prescription medicines may be prescribed if over-the-counter medicines do not work.  HOME CARE INSTRUCTIONS   Eat foods that have a lot of fiber, such as fruits, vegetables, whole grains, and beans.  Limit foods high in fat and processed sugars, such as french fries, hamburgers, cookies, candies, and soda.   A fiber supplement may be added to your diet if you cannot get enough fiber from foods.   Drink enough fluids to keep your urine clear or pale yellow.   Exercise regularly or as directed by your health care provider.   Go to the restroom when you have the urge to go. Do not hold it.   Only take over-the-counter or prescription medicines as directed by your health care provider. Do not take other medicines for constipation without talking to your health care provider first.  SEEK IMMEDIATE MEDICAL CARE IF:   You have bright red blood in your stool.   Your constipation lasts for more than 4 days or gets worse.   You have abdominal or rectal pain.   You have thin, pencil-like stools.   You have unexplained weight loss. MAKE SURE YOU:   Understand these instructions.  Will watch your condition.  Will get help right away if you are not doing well or get worse.   This information is not intended to replace advice given to you by your health care provider. Make sure you discuss any questions you have with your health  care provider.   Document Released: 09/10/2004 Document Revised: 01/03/2015 Document Reviewed: 09/24/2013 Elsevier Interactive Patient Education 2016 Elsevier Inc.  Dysmenorrhea Menstrual cramps (dysmenorrhea) are caused by the muscles of the uterus tightening (contracting) during a menstrual period. For some women, this discomfort is merely bothersome. For others, dysmenorrhea can be severe enough to interfere with everyday activities for a few days each month. Primary dysmenorrhea is menstrual cramps that last a couple of days when you start having menstrual periods or soon after. This often begins after a teenager starts having her period. As a woman gets older or has a baby, the cramps will usually lessen or disappear. Secondary dysmenorrhea begins later in life, lasts longer, and the pain may be stronger than primary dysmenorrhea. The pain may start before the period and last a few days after the period.  CAUSES  Dysmenorrhea is usually caused by an underlying problem, such as:  The tissue lining the uterus grows outside of the uterus in other  areas of the body (endometriosis).  The endometrial tissue, which normally lines the uterus, is found in or grows into the muscular walls of the uterus (adenomyosis).  The pelvic blood vessels are engorged with blood just before the menstrual period (pelvic congestive syndrome).  Overgrowth of cells (polyps) in the lining of the uterus or cervix.  Falling down of the uterus (prolapse) because of loose or stretched ligaments.  Depression.  Bladder problems, infection, or inflammation.  Problems with the intestine, a tumor, or irritable bowel syndrome.  Cancer of the female organs or bladder.  A severely tipped uterus.  A very tight opening or closed cervix.  Noncancerous tumors of the uterus (fibroids).  Pelvic inflammatory disease (PID).  Pelvic scarring (adhesions) from a previous surgery.  Ovarian cyst.  An intrauterine device  (IUD) used for birth control. RISK FACTORS You may be at greater risk of dysmenorrhea if:  You are younger than age 11.  You started puberty early.  You have irregular or heavy bleeding.  You have never given birth.  You have a family history of this problem.  You are a smoker. SIGNS AND SYMPTOMS   Cramping or throbbing pain in your lower abdomen.  Headaches.  Lower back pain.  Nausea or vomiting.  Diarrhea.  Sweating or dizziness.  Loose stools. DIAGNOSIS  A diagnosis is based on your history, symptoms, physical exam, diagnostic tests, or procedures. Diagnostic tests or procedures may include:  Blood tests.  Ultrasonography.  An examination of the lining of the uterus (dilation and curettage, D&C).  An examination inside your abdomen or pelvis with a scope (laparoscopy).  X-rays.  CT scan.  MRI.  An examination inside the bladder with a scope (cystoscopy).  An examination inside the intestine or stomach with a scope (colonoscopy, gastroscopy). TREATMENT  Treatment depends on the cause of the dysmenorrhea. Treatment may include:  Pain medicine prescribed by your health care provider.  Birth control pills or an IUD with progesterone hormone in it.  Hormone replacement therapy.  Nonsteroidal anti-inflammatory drugs (NSAIDs). These may help stop the production of prostaglandins.  Surgery to remove adhesions, endometriosis, ovarian cyst, or fibroids.  Removal of the uterus (hysterectomy).  Progesterone shots to stop the menstrual period.  Cutting the nerves on the sacrum that go to the female organs (presacral neurectomy).  Electric current to the sacral nerves (sacral nerve stimulation).  Antidepressant medicine.  Psychiatric therapy, counseling, or group therapy.  Exercise and physical therapy.  Meditation and yoga therapy.  Acupuncture. HOME CARE INSTRUCTIONS   Only take over-the-counter or prescription medicines as directed by your  health care provider.  Place a heating pad or hot water bottle on your lower back or abdomen. Do not sleep with the heating pad.  Use aerobic exercises, walking, swimming, biking, and other exercises to help lessen the cramping.  Massage to the lower back or abdomen may help.  Stop smoking.  Avoid alcohol and caffeine. SEEK MEDICAL CARE IF:   Your pain does not get better with medicine.  You have pain with sexual intercourse.  Your pain increases and is not controlled with medicines.  You have abnormal vaginal bleeding with your period.  You develop nausea or vomiting with your period that is not controlled with medicine. SEEK IMMEDIATE MEDICAL CARE IF:  You pass out.    This information is not intended to replace advice given to you by your health care provider. Make sure you discuss any questions you have with your health care provider.  Document Released: 12/13/2005 Document Revised: 08/15/2013 Document Reviewed: 05/31/2013 Elsevier Interactive Patient Education [employer]2016 Elsevier Inc.

## 2016-02-01 LAB — HIV ANTIBODY (ROUTINE TESTING W REFLEX): HIV Screen 4th Generation wRfx: NONREACTIVE

## 2016-02-02 LAB — GC/CHLAMYDIA PROBE AMP (~~LOC~~) NOT AT ARMC
Chlamydia: NEGATIVE
NEISSERIA GONORRHEA: NEGATIVE

## 2016-05-24 IMAGING — US US OB TRANSVAGINAL
1 series · 14 of 23 positions shown · non-contrast
Comparison: 09/28/2014

CLINICAL DATA: Threatened abortion. Pelvic pain. Continued vaginal
bleeding.

EXAM:
TRANSVAGINAL OB ULTRASOUND
TECHNIQUE: Transvaginal ultrasound was performed for complete evaluation of the
gestation as well as the maternal uterus, adnexal regions, and
pelvic cul-de-sac.

[Series 1: us ob transvaginal · 23 acquisitions, 14 frames shown]
[im 1/23]
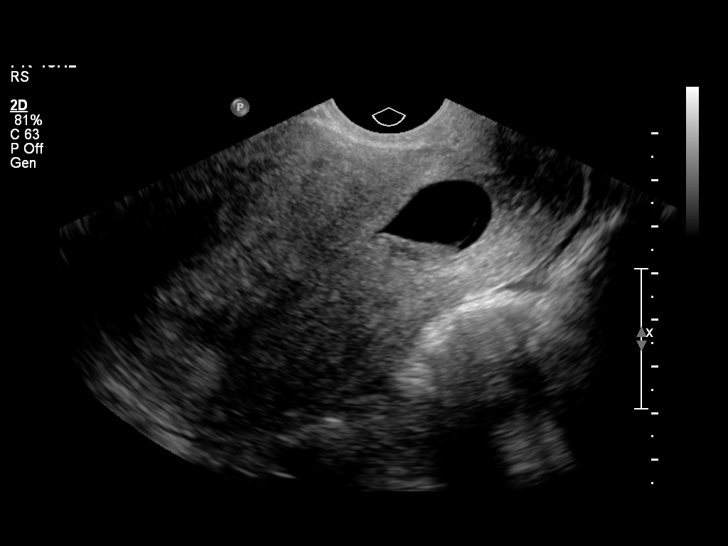
[im 3/23]
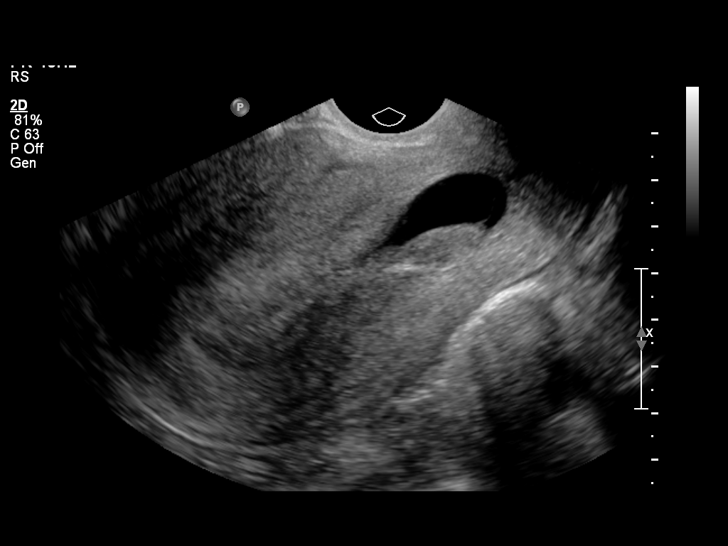
[im 5/23]
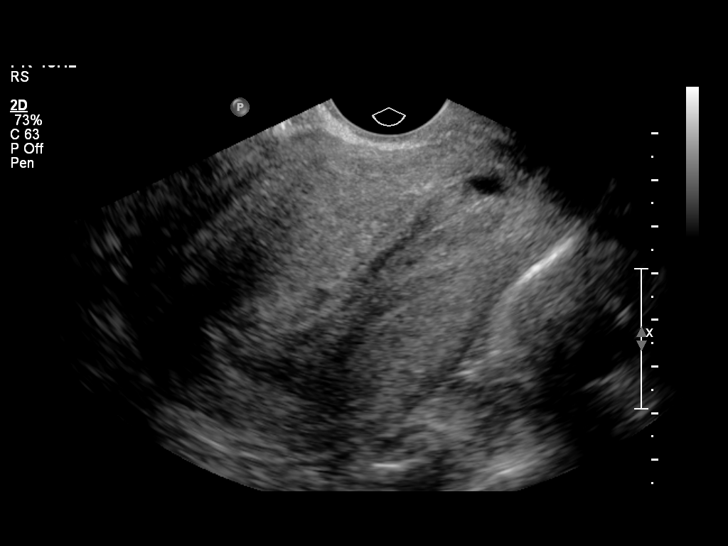
[im 6/23]
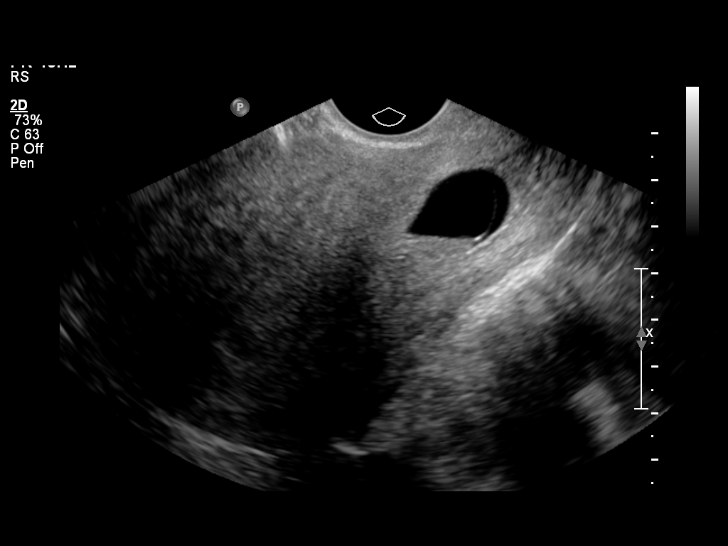
[im 8/23]
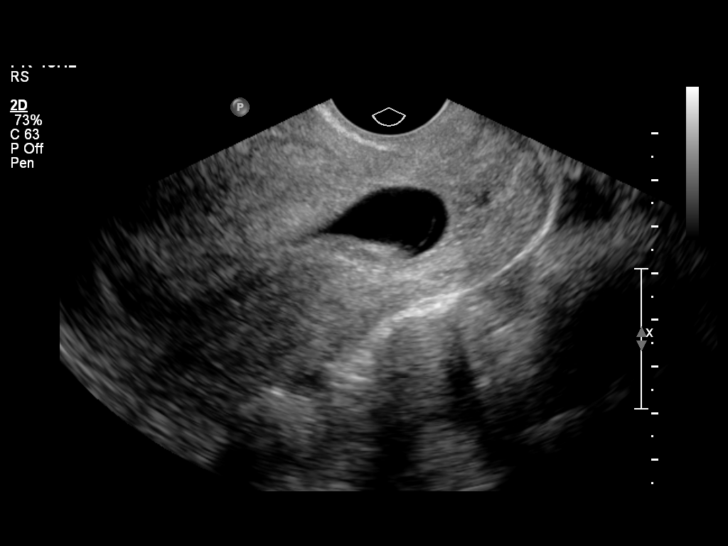
[im 10/23]
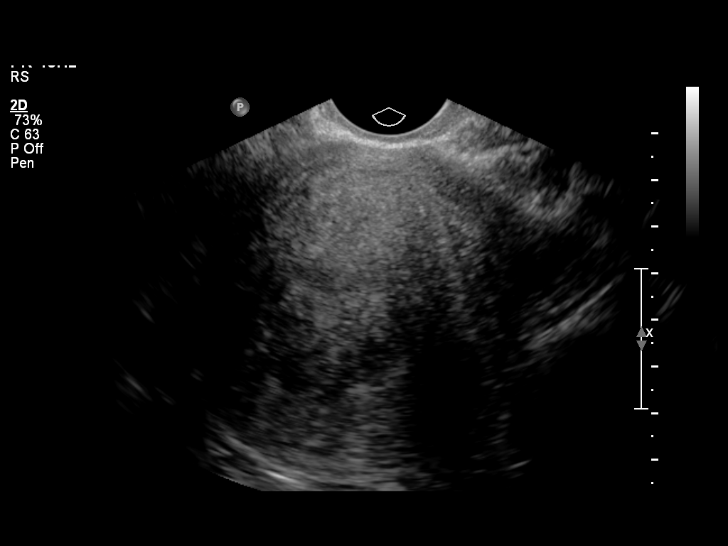
[im 11/23]
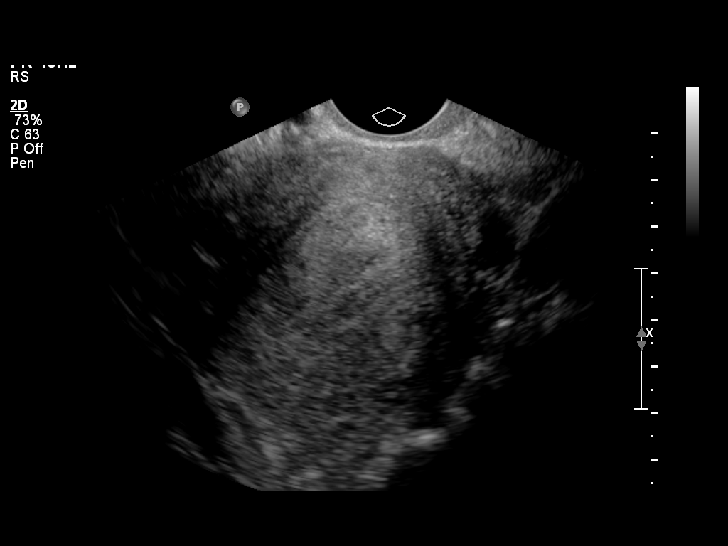
[im 13/23]
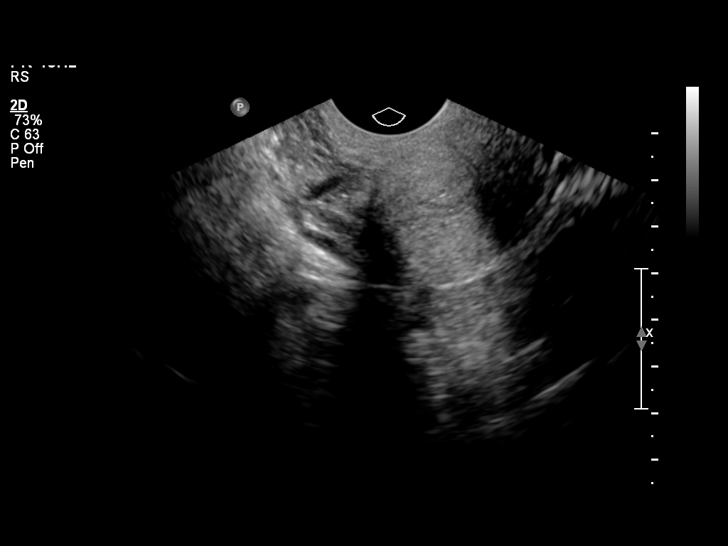
[im 14/23]
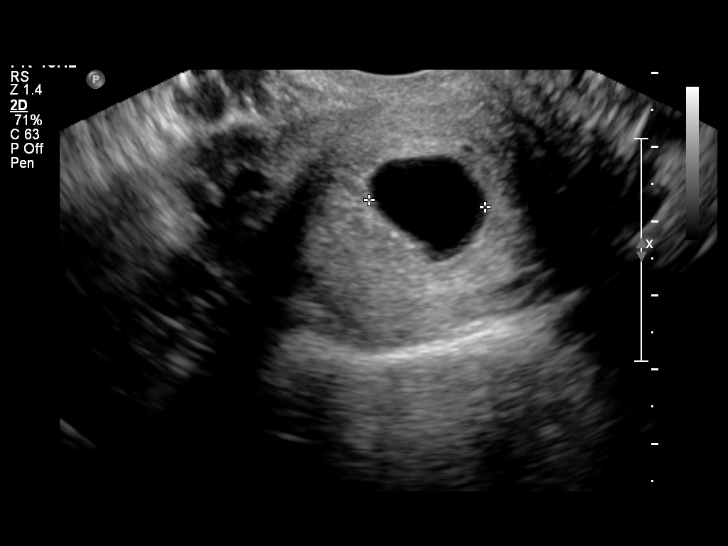
[im 16/23]
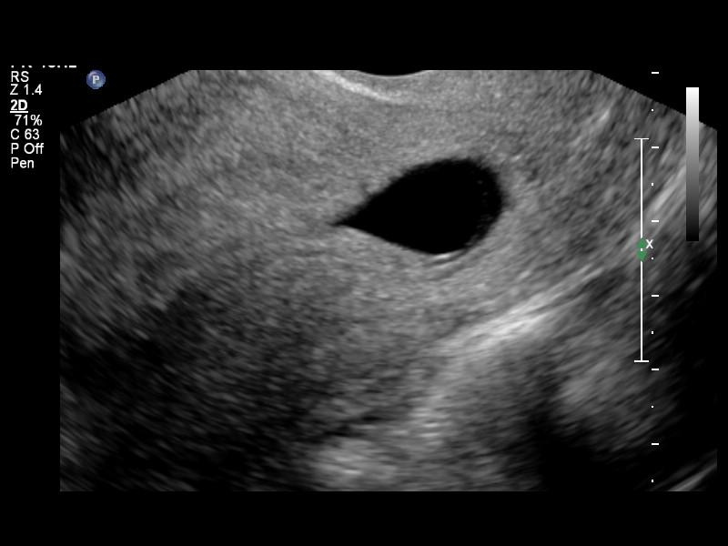
[im 18/23]
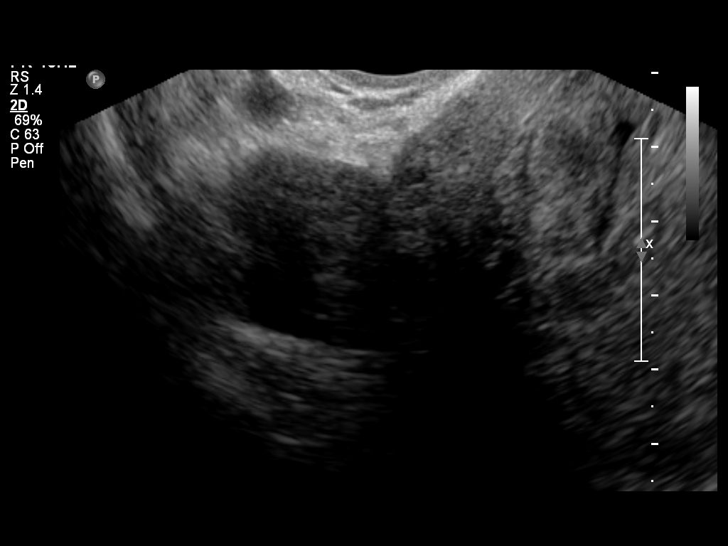
[im 19/23]
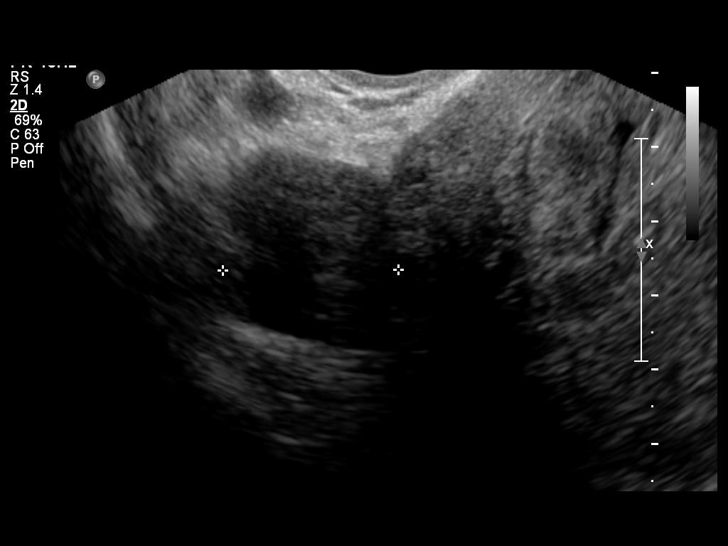
[im 21/23]
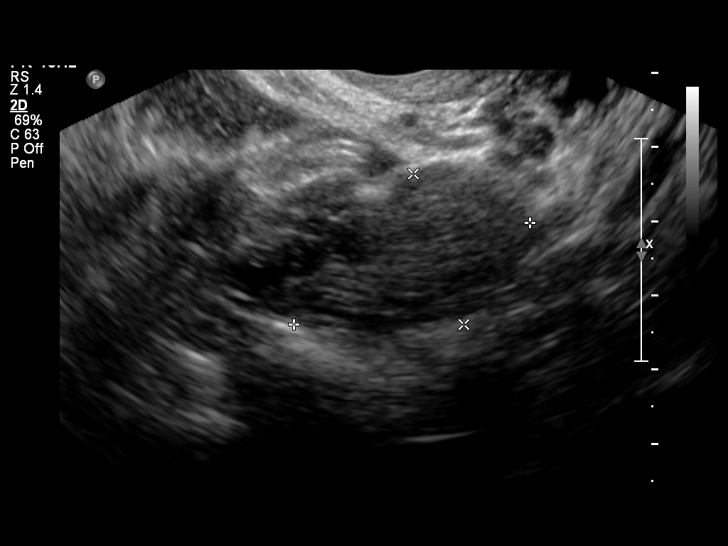
[im 23/23]
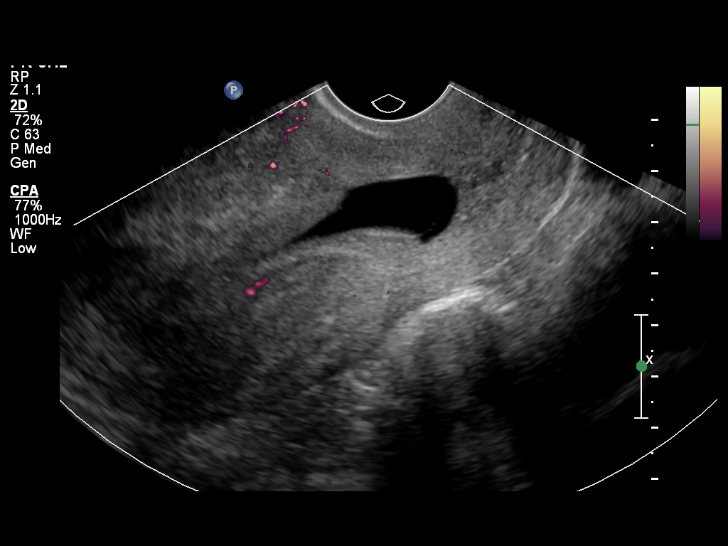

[14 of 23 positions shown; findings below may reference images not displayed]

FINDINGS: Intrauterine gestational sac: Abnormally shaped intrauterine
gestational sac has now migrated further into the lower uterine
segment of the endometrial cavity.

Yolk sac:  Not visualized

Embryo:  Not visualized

MSD: 20  mm   7 w   0  d

Maternal uterus/adnexae: Both ovaries are normal in appearance. No
adnexal mass or free fluid identified.
IMPRESSION: Persistent abnormal appearing intrauterine gestational sac has now
migrated further into the lower uterine segment, consistent with
impending spontaneous abortion.

## 2016-06-23 ENCOUNTER — Ambulatory Visit (INDEPENDENT_AMBULATORY_CARE_PROVIDER_SITE_OTHER): Payer: Managed Care, Other (non HMO) | Admitting: Family Medicine

## 2016-06-23 VITALS — BP 126/82 | HR 78 | Temp 98.5°F | Resp 18 | Ht 63.75 in | Wt 245.0 lb

## 2016-06-23 DIAGNOSIS — O039 Complete or unspecified spontaneous abortion without complication: Secondary | ICD-10-CM

## 2016-06-23 DIAGNOSIS — R21 Rash and other nonspecific skin eruption: Secondary | ICD-10-CM | POA: Diagnosis not present

## 2016-06-23 LAB — POCT URINE PREGNANCY: Preg Test, Ur: NEGATIVE

## 2016-06-23 NOTE — Progress Notes (Signed)
   HPI  [facility]   She states that the rash showed up about 1 week ago and has improved, nearly resolved, since that time. Described as an itchy red bumpy rash on R neck, BL chest and BL arms. Her husband thinks she may be gluten sensitive b/c she has persistent loose stools and recurrent similar rash. She has bee reducing gluten since this time.   Miscarriage  Last month she had an eipisode of heavy vaginal bleeding that last ed a few days after she had missed 3 months periods and had a positive test. Since that tim eshe has had a normal period which ended last week.   She denies any abdominal pain. She goes to a pain doctor who prscribes tramadol and [place] in [place]where [place] lives [place]) and would like a letter stating that she is not pregnant.    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    Orders Placed This Encounter  Procedures  . POCT urine pregnancy    Murtis SinkSam Bradshaw, MD [facility] 06/23/2016, 1:15 PM

## 2016-06-23 NOTE — Patient Instructions (Addendum)
  Great to meet you!  Try to cut out gluten to see if you get improvement in your loose stools.   See the handout I gave you for me details   IF you received an x-ray today, you will receive an invoice from [facility]. Please contact [facility] at [phone] with questions or concerns regarding your invoice.   IF you received labwork today, you will receive an invoice from [facility]. Please contact [facility] at [phone] with questions or concerns regarding your invoice.   Our billing staff will not be able to assist you with questions regarding bills from these companies.  You will be contacted with the lab results as soon as they are available. The fastest way to get your results is to activate your My Chart account. Instructions are located on the last page of this paperwork. If you have not heard from us regarding the results in 2 weeks, please contact this office.

## 2016-09-28 ENCOUNTER — Ambulatory Visit (INDEPENDENT_AMBULATORY_CARE_PROVIDER_SITE_OTHER): Payer: Self-pay

## 2016-09-28 ENCOUNTER — Ambulatory Visit (HOSPITAL_COMMUNITY)
Admission: EM | Admit: 2016-09-28 | Discharge: 2016-09-28 | Disposition: A | Discharge status: Home / Self Care | Payer: Managed Care, Other (non HMO) | Attending: Family Medicine | Admitting: Family Medicine

## 2016-09-28 ENCOUNTER — Ambulatory Visit: Payer: Managed Care, Other (non HMO)

## 2016-09-28 ENCOUNTER — Encounter (HOSPITAL_COMMUNITY): Payer: Self-pay | Admitting: Family Medicine

## 2016-09-28 DIAGNOSIS — G8929 Other chronic pain: Secondary | ICD-10-CM

## 2016-09-28 DIAGNOSIS — M25512 Pain in left shoulder: Secondary | ICD-10-CM | POA: Diagnosis not present

## 2016-09-28 NOTE — Discharge Instructions (Signed)
Follow-up with your physician in [place] as planned for further management of left shoulder pain.

## 2016-09-28 NOTE — ED Triage Notes (Signed)
Pt here for chronic left shoulder pain over 1 year. sts she is being seen by pain management and sent ehre for a shoulder xray. sts she needs an xray so she can get a steroid shot.
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
